# Patient Record
Sex: Female | Born: 1950
Health system: Southern US, Community
[De-identification: ages and names within clinical notes are randomized; demographics above are authoritative.]

## PROBLEM LIST (undated history)

## (undated) DIAGNOSIS — E039 Hypothyroidism, unspecified: Secondary | ICD-10-CM

## (undated) HISTORY — PX: FRACTURE SURGERY: SHX138

## (undated) HISTORY — PX: WRIST SURGERY: SHX841

---

## 2007-05-30 ENCOUNTER — Emergency Department: Payer: Self-pay | Admitting: Emergency Medicine

## 2008-07-11 ENCOUNTER — Inpatient Hospital Stay: Payer: Self-pay | Admitting: General Practice

## 2014-01-16 ENCOUNTER — Ambulatory Visit: Payer: Self-pay | Admitting: Internal Medicine

## 2014-12-08 ENCOUNTER — Encounter
Admission: RE | Disposition: A | Discharge status: Home / Self Care | Payer: Self-pay | Source: Ambulatory Visit | Attending: Unknown Physician Specialty

## 2014-12-08 ENCOUNTER — Ambulatory Visit: Payer: PPO | Admitting: Anesthesiology

## 2014-12-08 ENCOUNTER — Ambulatory Visit
Admission: RE | Admit: 2014-12-08 | Discharge: 2014-12-08 | Disposition: A | Discharge status: Home / Self Care | Payer: PPO | Source: Ambulatory Visit | Attending: Unknown Physician Specialty | Admitting: Unknown Physician Specialty

## 2014-12-08 ENCOUNTER — Encounter: Payer: Self-pay | Admitting: *Deleted

## 2014-12-08 DIAGNOSIS — I10 Essential (primary) hypertension: Secondary | ICD-10-CM | POA: Insufficient documentation

## 2014-12-08 DIAGNOSIS — D122 Benign neoplasm of ascending colon: Secondary | ICD-10-CM | POA: Insufficient documentation

## 2014-12-08 DIAGNOSIS — D125 Benign neoplasm of sigmoid colon: Secondary | ICD-10-CM | POA: Diagnosis not present

## 2014-12-08 DIAGNOSIS — K5730 Diverticulosis of large intestine without perforation or abscess without bleeding: Secondary | ICD-10-CM | POA: Insufficient documentation

## 2014-12-08 DIAGNOSIS — Z79899 Other long term (current) drug therapy: Secondary | ICD-10-CM | POA: Diagnosis not present

## 2014-12-08 DIAGNOSIS — Z86010 Personal history of colonic polyps: Secondary | ICD-10-CM | POA: Insufficient documentation

## 2014-12-08 DIAGNOSIS — K640 First degree hemorrhoids: Secondary | ICD-10-CM | POA: Diagnosis not present

## 2014-12-08 DIAGNOSIS — D123 Benign neoplasm of transverse colon: Secondary | ICD-10-CM | POA: Diagnosis not present

## 2014-12-08 DIAGNOSIS — E039 Hypothyroidism, unspecified: Secondary | ICD-10-CM | POA: Insufficient documentation

## 2014-12-08 DIAGNOSIS — D124 Benign neoplasm of descending colon: Secondary | ICD-10-CM | POA: Diagnosis not present

## 2014-12-08 HISTORY — PX: COLONOSCOPY WITH PROPOFOL: SHX5780

## 2014-12-08 HISTORY — DX: Hypothyroidism, unspecified: E03.9

## 2014-12-08 SURGERY — COLONOSCOPY WITH PROPOFOL
Anesthesia: General

## 2014-12-08 MED ORDER — PROPOFOL INFUSION 10 MG/ML OPTIME
INTRAVENOUS | Status: DC | PRN
  Administered 2014-12-08: 120 ug/kg/min via INTRAVENOUS

## 2014-12-08 MED ORDER — SODIUM CHLORIDE 0.9 % IV SOLN: INTRAVENOUS | Status: DC

## 2014-12-08 MED ORDER — LACTATED RINGERS IV SOLN
INTRAVENOUS | Status: DC
Start: 2014-12-08 — End: 2014-12-08
  Administered 2014-12-08: 09:00:00 via INTRAVENOUS

## 2014-12-08 MED ORDER — FENTANYL CITRATE (PF) 100 MCG/2ML IJ SOLN
INTRAMUSCULAR | Status: DC | PRN
  Administered 2014-12-08: 50 ug via INTRAVENOUS

## 2014-12-08 MED ORDER — EPHEDRINE SULFATE 50 MG/ML IJ SOLN
INTRAMUSCULAR | Status: DC | PRN
  Administered 2014-12-08 (×4): 5 mg via INTRAVENOUS

## 2014-12-08 MED ORDER — MIDAZOLAM HCL 2 MG/2ML IJ SOLN
INTRAMUSCULAR | Status: DC | PRN
  Administered 2014-12-08: 1 mg via INTRAVENOUS

## 2014-12-08 NOTE — H&P (Signed)
Primary Care Physician:  Glendon Axe, MD Primary Gastroenterologist:  Dr. Vira Agar  Pre-Procedure History & Physical: HPI:  Melissa Meadows is a 64 y.o. female is here for an colonoscopy.   Past Medical History  Diagnosis Date  . Hypothyroidism     Past Surgical History  Procedure Laterality Date  . Fracture surgery      LT Knee  . Wrist surgery Left     Prior to Admission medications   Medication Sig Start Date End Date Taking? Authorizing Provider  latanoprost (XALATAN) 0.005 % ophthalmic solution 1 drop at bedtime.   Yes Historical Provider, MD  levothyroxine (SYNTHROID, LEVOTHROID) 75 MCG tablet Take 75 mcg by mouth daily before breakfast.   Yes Historical Provider, MD    Allergies as of 10/07/2014  . (Not on File)    History reviewed. No pertinent family history.  History   Social History  . Marital Status: Widowed    Spouse Name: N/A  . Number of Children: N/A  . Years of Education: N/A   Occupational History  . Not on file.   Social History Main Topics  . Smoking status: Never Smoker   . Smokeless tobacco: Never Used  . Alcohol Use: No  . Drug Use: No  . Sexual Activity: Not on file   Other Topics Concern  . Not on file   Social History Narrative  . No narrative on file    Review of Systems: See HPI, otherwise negative ROS  Physical Exam: BP 112/61 mmHg  Pulse 95  Temp(Src) 98.2 F (36.8 C) (Tympanic)  Resp 16  Ht 5\' 2"  (1.575 m)  Wt 6.35 kg (14 lb)  BMI 2.56 kg/m2  SpO2 97% General:   Alert,  pleasant and cooperative in NAD Head:  Normocephalic and atraumatic. Neck:  Supple; no masses or thyromegaly. Lungs:  Clear throughout to auscultation.    Heart:  Regular rate and rhythm. Abdomen:  Soft, nontender and nondistended. Normal bowel sounds, without guarding, and without rebound.   Neurologic:  Alert and  oriented x4;  grossly normal neurologically.  Impression/Plan: Melissa Meadows is here for an colonoscopy to be performed for  personal history of colon polyps  Risks, benefits, limitations, and alternatives regarding  colonoscopy have been reviewed with the patient.  Questions have been answered.  All parties agreeable.   Gaylyn Cheers, MD  12/08/2014, 8:50 AM   Primary Care Physician:  Glendon Axe, MD Primary Gastroenterologist:  Dr. Vira Agar  Pre-Procedure History & Physical: HPI:  Melissa Meadows is a 64 y.o. female is here for an colonoscopy.   Past Medical History  Diagnosis Date  . Hypothyroidism     Past Surgical History  Procedure Laterality Date  . Fracture surgery      LT Knee  . Wrist surgery Left     Prior to Admission medications   Medication Sig Start Date End Date Taking? Authorizing Provider  latanoprost (XALATAN) 0.005 % ophthalmic solution 1 drop at bedtime.   Yes Historical Provider, MD  levothyroxine (SYNTHROID, LEVOTHROID) 75 MCG tablet Take 75 mcg by mouth daily before breakfast.   Yes Historical Provider, MD    Allergies as of 10/07/2014  . (Not on File)    History reviewed. No pertinent family history.  History   Social History  . Marital Status: Widowed    Spouse Name: N/A  . Number of Children: N/A  . Years of Education: N/A   Occupational History  . Not on file.   Social History  Main Topics  . Smoking status: Never Smoker   . Smokeless tobacco: Never Used  . Alcohol Use: No  . Drug Use: No  . Sexual Activity: Not on file   Other Topics Concern  . Not on file   Social History Narrative  . No narrative on file    Review of Systems: See HPI, otherwise negative ROS  Physical Exam: BP 112/61 mmHg  Pulse 95  Temp(Src) 98.2 F (36.8 C) (Tympanic)  Resp 16  Ht 5\' 2"  (1.575 m)  Wt 6.35 kg (14 lb)  BMI 2.56 kg/m2  SpO2 97% General:   Alert,  pleasant and cooperative in NAD Head:  Normocephalic and atraumatic. Neck:  Supple; no masses or thyromegaly. Lungs:  Clear throughout to auscultation.    Heart:  Regular rate and rhythm. Abdomen:  Soft,  nontender and nondistended. Normal bowel sounds, without guarding, and without rebound.   Neurologic:  Alert and  oriented x4;  grossly normal neurologically.  Impression/Plan: Melissa Meadows is here for an colonoscopy to be performed for personal history of colon polyps  Risks, benefits, limitations, and alternatives regarding  colonoscopy have been reviewed with the patient.  Questions have been answered.  All parties agreeable.   Gaylyn Cheers, MD  12/08/2014, 8:50 AM

## 2014-12-08 NOTE — Transfer of Care (Signed)
Immediate Anesthesia Transfer of Care Note  Patient: Melissa Meadows  Procedure(s) Performed: Procedure(s): COLONOSCOPY WITH PROPOFOL (N/A)  Patient Location: PACU  Anesthesia Type:General  Level of Consciousness: awake and sedated  Airway & Oxygen Therapy: Patient Spontanous Breathing and Patient connected to nasal cannula oxygen  Post-op Assessment: Report given to RN and Post -op Vital signs reviewed and stable  Post vital signs: Reviewed and stable  Last Vitals:  Filed Vitals:   12/08/14 0822  BP: 112/61  Pulse: 95  Temp: 36.8 C  Resp: 16    Complications: No apparent anesthesia complications

## 2014-12-08 NOTE — Anesthesia Procedure Notes (Signed)
Performed by: COOK-MARTIN, Sonna Lipsky Pre-anesthesia Checklist: Patient identified, Emergency Drugs available, Suction available, Patient being monitored and Timeout performed Patient Re-evaluated:Patient Re-evaluated prior to inductionOxygen Delivery Method: Nasal cannula Preoxygenation: Pre-oxygenation with 100% oxygen Intubation Type: IV induction Placement Confirmation: positive ETCO2 and CO2 detector       

## 2014-12-08 NOTE — Anesthesia Postprocedure Evaluation (Signed)
  Anesthesia Post-op Note  Patient: Melissa Meadows  Procedure(s) Performed: Procedure(s): COLONOSCOPY WITH PROPOFOL (N/A)  Anesthesia type:General  Patient location: PACU  Post pain: Pain level controlled  Post assessment: Post-op Vital signs reviewed, Patient's Cardiovascular Status Stable, Respiratory Function Stable, Patent Airway and No signs of Nausea or vomiting  Post vital signs: Reviewed and stable  Last Vitals:  Filed Vitals:   12/08/14 0950  BP: 109/57  Pulse: 74  Temp:   Resp: 15    Level of consciousness: awake, alert  and patient cooperative  Complications: No apparent anesthesia complications

## 2014-12-08 NOTE — Anesthesia Preprocedure Evaluation (Signed)
Anesthesia Evaluation  Patient identified by MRN, date of birth, ID band Patient awake    Reviewed: Allergy & Precautions, NPO status , reviewed documented beta blocker date and time   Airway Mallampati: III       Dental  (+) Upper Dentures, Lower Dentures   Pulmonary neg pulmonary ROS,    Pulmonary exam normal       Cardiovascular hypertension, Normal cardiovascular exam    Neuro/Psych negative neurological ROS  negative psych ROS   GI/Hepatic negative GI ROS, Neg liver ROS,   Endo/Other  Hypothyroidism   Renal/GU      Musculoskeletal negative musculoskeletal ROS (+)   Abdominal Normal abdominal exam  (+)   Peds  Hematology   Anesthesia Other Findings   Reproductive/Obstetrics negative OB ROS                             Anesthesia Physical Anesthesia Plan  ASA: II  Anesthesia Plan: General   Post-op Pain Management:    Induction: Intravenous  Airway Management Planned: Nasal Cannula  Additional Equipment:   Intra-op Plan:   Post-operative Plan:   Informed Consent: I have reviewed the patients History and Physical, chart, labs and discussed the procedure including the risks, benefits and alternatives for the proposed anesthesia with the patient or authorized representative who has indicated his/her understanding and acceptance.     Plan Discussed with: CRNA  Anesthesia Plan Comments:         Anesthesia Quick Evaluation

## 2014-12-08 NOTE — Op Note (Signed)
[facility] Gastroenterology Patient Name: Melissa Meadows Procedure Date: 12/08/2014 8:53 AM MRN: [id number] Account #: [id number] Date of Birth: 01-17-1951 Admit Type: Outpatient Age: 64 Room: [facility] ENDO ROOM 1 Gender: Female Note Status: Finalized Procedure:         Colonoscopy Indications:       Personal history of colonic polyps Providers:         Manya Silvas, MD Referring MD:      Glendon Axe (Referring MD) Medicines:         Propofol per Anesthesia Complications:     No immediate complications. Procedure:         Pre-Anesthesia Assessment:                    - After reviewing the risks and benefits, the patient was                     deemed in satisfactory condition to undergo the procedure.                    After obtaining informed consent, the colonoscope was                     passed under direct vision. Throughout the procedure, the                     patient's blood pressure, pulse, and oxygen saturations                     were monitored continuously. The Colonoscope was                     introduced through the anus and advanced to the the cecum,                     identified by appendiceal orifice and ileocecal valve. The                     colonoscopy was performed without difficulty. The patient                     tolerated the procedure well. The quality of the bowel                     preparation was excellent. Findings:      Many sessile polyps were found in the sigmoid colon, in the descending       colon, in the transverse colon and in the ascending colon. The polyps       were small in size. These polyps were removed with a hot snare. and a       jumbo forceps. Resection and retrieval were complete.      3 were from sigmoid bottle 1      2 from descending bottle 2      43from ascending bottle3 and 4 and 5      2 from transverse bottle 6 and 7      1 from sigmoid bottle 8      Internal hemorrhoids were found during endoscopy.  The hemorrhoids were       small, medium-sized and Grade I (internal hemorrhoids that do not       prolapse).      A single small-mouthed diverticulum was found in the sigmoid colon. Impression:        -  Many small polyps in the sigmoid colon, in the                     descending colon, in the transverse colon and in the                     ascending colon. Resected and retrieved.                    - Internal hemorrhoids.                    - Diverticulosis in the sigmoid colon. Recommendation:    - Await pathology results. Manya Silvas, MD 12/08/2014 9:39:30 AM This report has been signed electronically. Number of Addenda: 0 Note Initiated On: 12/08/2014 8:53 AM Scope Withdrawal Time: 0 hours 14 minutes 23 seconds  Total Procedure Duration: 0 hours 30 minutes 5 seconds       [facility]

## 2014-12-09 ENCOUNTER — Encounter: Payer: Self-pay | Admitting: Unknown Physician Specialty

## 2014-12-10 LAB — SURGICAL PATHOLOGY

## 2015-07-15 DIAGNOSIS — M8580 Other specified disorders of bone density and structure, unspecified site: Secondary | ICD-10-CM | POA: Diagnosis not present

## 2015-07-15 DIAGNOSIS — R740 Nonspecific elevation of levels of transaminase and lactic acid dehydrogenase [LDH]: Secondary | ICD-10-CM | POA: Diagnosis not present

## 2015-09-29 DIAGNOSIS — R740 Nonspecific elevation of levels of transaminase and lactic acid dehydrogenase [LDH]: Secondary | ICD-10-CM | POA: Diagnosis not present

## 2015-10-06 DIAGNOSIS — M8589 Other specified disorders of bone density and structure, multiple sites: Secondary | ICD-10-CM | POA: Diagnosis not present

## 2015-10-06 DIAGNOSIS — J3089 Other allergic rhinitis: Secondary | ICD-10-CM | POA: Diagnosis not present

## 2015-10-06 DIAGNOSIS — R030 Elevated blood-pressure reading, without diagnosis of hypertension: Secondary | ICD-10-CM | POA: Diagnosis not present

## 2015-10-12 DIAGNOSIS — H40053 Ocular hypertension, bilateral: Secondary | ICD-10-CM | POA: Diagnosis not present

## 2016-04-11 DIAGNOSIS — H40053 Ocular hypertension, bilateral: Secondary | ICD-10-CM | POA: Diagnosis not present

## 2016-05-03 DIAGNOSIS — H903 Sensorineural hearing loss, bilateral: Secondary | ICD-10-CM | POA: Diagnosis not present

## 2016-05-06 DIAGNOSIS — E039 Hypothyroidism, unspecified: Secondary | ICD-10-CM | POA: Diagnosis not present

## 2016-05-13 DIAGNOSIS — Q969 Turner's syndrome, unspecified: Secondary | ICD-10-CM | POA: Diagnosis not present

## 2016-05-13 DIAGNOSIS — E039 Hypothyroidism, unspecified: Secondary | ICD-10-CM | POA: Diagnosis not present

## 2016-05-13 DIAGNOSIS — E063 Autoimmune thyroiditis: Secondary | ICD-10-CM | POA: Diagnosis not present

## 2016-10-05 DIAGNOSIS — Z131 Encounter for screening for diabetes mellitus: Secondary | ICD-10-CM | POA: Diagnosis not present

## 2016-10-05 DIAGNOSIS — Z23 Encounter for immunization: Secondary | ICD-10-CM | POA: Diagnosis not present

## 2016-10-05 DIAGNOSIS — Z1231 Encounter for screening mammogram for malignant neoplasm of breast: Secondary | ICD-10-CM | POA: Diagnosis not present

## 2016-10-05 DIAGNOSIS — Z0000 Encounter for general adult medical examination without abnormal findings: Secondary | ICD-10-CM | POA: Diagnosis not present

## 2016-10-05 DIAGNOSIS — M8589 Other specified disorders of bone density and structure, multiple sites: Secondary | ICD-10-CM | POA: Diagnosis not present

## 2016-10-05 DIAGNOSIS — R030 Elevated blood-pressure reading, without diagnosis of hypertension: Secondary | ICD-10-CM | POA: Diagnosis not present

## 2016-10-10 DIAGNOSIS — H40053 Ocular hypertension, bilateral: Secondary | ICD-10-CM | POA: Diagnosis not present

## 2016-10-12 DIAGNOSIS — R319 Hematuria, unspecified: Secondary | ICD-10-CM | POA: Diagnosis not present

## 2016-10-12 DIAGNOSIS — M8589 Other specified disorders of bone density and structure, multiple sites: Secondary | ICD-10-CM | POA: Diagnosis not present

## 2016-10-12 DIAGNOSIS — M8588 Other specified disorders of bone density and structure, other site: Secondary | ICD-10-CM | POA: Diagnosis not present

## 2016-10-12 DIAGNOSIS — R8290 Unspecified abnormal findings in urine: Secondary | ICD-10-CM | POA: Diagnosis not present

## 2016-12-30 DIAGNOSIS — M47812 Spondylosis without myelopathy or radiculopathy, cervical region: Secondary | ICD-10-CM | POA: Diagnosis not present

## 2016-12-30 DIAGNOSIS — M542 Cervicalgia: Secondary | ICD-10-CM | POA: Diagnosis not present

## 2017-04-10 DIAGNOSIS — H40053 Ocular hypertension, bilateral: Secondary | ICD-10-CM | POA: Diagnosis not present

## 2017-04-24 DIAGNOSIS — Q969 Turner's syndrome, unspecified: Secondary | ICD-10-CM | POA: Diagnosis not present

## 2017-05-05 DIAGNOSIS — E039 Hypothyroidism, unspecified: Secondary | ICD-10-CM | POA: Diagnosis not present

## 2017-05-15 DIAGNOSIS — E038 Other specified hypothyroidism: Secondary | ICD-10-CM | POA: Diagnosis not present

## 2017-05-15 DIAGNOSIS — E063 Autoimmune thyroiditis: Secondary | ICD-10-CM | POA: Diagnosis not present

## 2017-05-15 DIAGNOSIS — Q969 Turner's syndrome, unspecified: Secondary | ICD-10-CM | POA: Diagnosis not present

## 2017-07-10 DIAGNOSIS — D485 Neoplasm of uncertain behavior of skin: Secondary | ICD-10-CM | POA: Diagnosis not present

## 2017-07-10 DIAGNOSIS — M6740 Ganglion, unspecified site: Secondary | ICD-10-CM | POA: Diagnosis not present

## 2017-07-10 DIAGNOSIS — L918 Other hypertrophic disorders of the skin: Secondary | ICD-10-CM | POA: Diagnosis not present

## 2017-07-10 DIAGNOSIS — L821 Other seborrheic keratosis: Secondary | ICD-10-CM | POA: Diagnosis not present

## 2017-07-24 DIAGNOSIS — C44319 Basal cell carcinoma of skin of other parts of face: Secondary | ICD-10-CM | POA: Diagnosis not present

## 2017-07-24 DIAGNOSIS — D485 Neoplasm of uncertain behavior of skin: Secondary | ICD-10-CM | POA: Diagnosis not present

## 2017-08-02 DIAGNOSIS — C44319 Basal cell carcinoma of skin of other parts of face: Secondary | ICD-10-CM | POA: Diagnosis not present

## 2017-08-16 DIAGNOSIS — C44319 Basal cell carcinoma of skin of other parts of face: Secondary | ICD-10-CM | POA: Diagnosis not present

## 2017-09-06 DIAGNOSIS — C44319 Basal cell carcinoma of skin of other parts of face: Secondary | ICD-10-CM | POA: Diagnosis not present

## 2017-09-28 DIAGNOSIS — H40053 Ocular hypertension, bilateral: Secondary | ICD-10-CM | POA: Diagnosis not present

## 2018-01-01 DIAGNOSIS — C44319 Basal cell carcinoma of skin of other parts of face: Secondary | ICD-10-CM | POA: Diagnosis not present

## 2018-01-01 DIAGNOSIS — L821 Other seborrheic keratosis: Secondary | ICD-10-CM | POA: Diagnosis not present

## 2018-01-01 DIAGNOSIS — L720 Epidermal cyst: Secondary | ICD-10-CM | POA: Diagnosis not present

## 2018-01-09 DIAGNOSIS — E038 Other specified hypothyroidism: Secondary | ICD-10-CM | POA: Diagnosis not present

## 2018-01-09 DIAGNOSIS — E063 Autoimmune thyroiditis: Secondary | ICD-10-CM | POA: Diagnosis not present

## 2018-01-09 DIAGNOSIS — E538 Deficiency of other specified B group vitamins: Secondary | ICD-10-CM | POA: Diagnosis not present

## 2018-01-09 DIAGNOSIS — E519 Thiamine deficiency, unspecified: Secondary | ICD-10-CM | POA: Diagnosis not present

## 2018-01-09 DIAGNOSIS — R413 Other amnesia: Secondary | ICD-10-CM | POA: Diagnosis not present

## 2018-01-10 ENCOUNTER — Other Ambulatory Visit (HOSPITAL_COMMUNITY): Payer: Self-pay | Admitting: Neurology

## 2018-01-10 DIAGNOSIS — E519 Thiamine deficiency, unspecified: Secondary | ICD-10-CM | POA: Diagnosis not present

## 2018-01-10 DIAGNOSIS — E063 Autoimmune thyroiditis: Secondary | ICD-10-CM | POA: Diagnosis not present

## 2018-01-10 DIAGNOSIS — E538 Deficiency of other specified B group vitamins: Secondary | ICD-10-CM | POA: Diagnosis not present

## 2018-01-10 DIAGNOSIS — R413 Other amnesia: Secondary | ICD-10-CM | POA: Diagnosis not present

## 2018-01-10 DIAGNOSIS — E038 Other specified hypothyroidism: Secondary | ICD-10-CM | POA: Diagnosis not present

## 2018-01-16 ENCOUNTER — Ambulatory Visit (HOSPITAL_COMMUNITY)
Admission: RE | Admit: 2018-01-16 | Discharge: 2018-01-16 | Disposition: A | Discharge status: Home / Self Care | Payer: PPO | Source: Ambulatory Visit | Attending: Neurology | Admitting: Neurology

## 2018-01-16 DIAGNOSIS — R930 Abnormal findings on diagnostic imaging of skull and head, not elsewhere classified: Secondary | ICD-10-CM | POA: Diagnosis not present

## 2018-01-16 DIAGNOSIS — R413 Other amnesia: Secondary | ICD-10-CM | POA: Diagnosis not present

## 2018-01-17 DIAGNOSIS — E538 Deficiency of other specified B group vitamins: Secondary | ICD-10-CM | POA: Diagnosis not present

## 2018-01-17 DIAGNOSIS — D510 Vitamin B12 deficiency anemia due to intrinsic factor deficiency: Secondary | ICD-10-CM | POA: Diagnosis not present

## 2018-01-24 DIAGNOSIS — E538 Deficiency of other specified B group vitamins: Secondary | ICD-10-CM | POA: Diagnosis not present

## 2018-02-01 DIAGNOSIS — E538 Deficiency of other specified B group vitamins: Secondary | ICD-10-CM | POA: Diagnosis not present

## 2018-02-08 DIAGNOSIS — E538 Deficiency of other specified B group vitamins: Secondary | ICD-10-CM | POA: Diagnosis not present

## 2018-03-12 DIAGNOSIS — E538 Deficiency of other specified B group vitamins: Secondary | ICD-10-CM | POA: Diagnosis not present

## 2018-03-28 DIAGNOSIS — H2513 Age-related nuclear cataract, bilateral: Secondary | ICD-10-CM | POA: Diagnosis not present

## 2018-04-11 DIAGNOSIS — Q969 Turner's syndrome, unspecified: Secondary | ICD-10-CM | POA: Diagnosis not present

## 2018-04-11 DIAGNOSIS — E538 Deficiency of other specified B group vitamins: Secondary | ICD-10-CM | POA: Diagnosis not present

## 2018-04-11 DIAGNOSIS — R413 Other amnesia: Secondary | ICD-10-CM | POA: Diagnosis not present

## 2018-04-12 DIAGNOSIS — E538 Deficiency of other specified B group vitamins: Secondary | ICD-10-CM | POA: Diagnosis not present

## 2018-05-10 DIAGNOSIS — E038 Other specified hypothyroidism: Secondary | ICD-10-CM | POA: Diagnosis not present

## 2018-05-10 DIAGNOSIS — E063 Autoimmune thyroiditis: Secondary | ICD-10-CM | POA: Diagnosis not present

## 2018-05-18 DIAGNOSIS — E063 Autoimmune thyroiditis: Secondary | ICD-10-CM | POA: Diagnosis not present

## 2018-05-18 DIAGNOSIS — Q969 Turner's syndrome, unspecified: Secondary | ICD-10-CM | POA: Diagnosis not present

## 2018-05-18 DIAGNOSIS — E038 Other specified hypothyroidism: Secondary | ICD-10-CM | POA: Diagnosis not present

## 2018-05-18 DIAGNOSIS — E041 Nontoxic single thyroid nodule: Secondary | ICD-10-CM | POA: Diagnosis not present

## 2018-07-18 DIAGNOSIS — E038 Other specified hypothyroidism: Secondary | ICD-10-CM | POA: Diagnosis not present

## 2018-07-18 DIAGNOSIS — E063 Autoimmune thyroiditis: Secondary | ICD-10-CM | POA: Diagnosis not present

## 2018-11-26 DIAGNOSIS — L821 Other seborrheic keratosis: Secondary | ICD-10-CM | POA: Diagnosis not present

## 2018-11-26 DIAGNOSIS — R030 Elevated blood-pressure reading, without diagnosis of hypertension: Secondary | ICD-10-CM | POA: Diagnosis not present

## 2018-11-26 DIAGNOSIS — E063 Autoimmune thyroiditis: Secondary | ICD-10-CM | POA: Diagnosis not present

## 2018-11-26 DIAGNOSIS — C4441 Basal cell carcinoma of skin of scalp and neck: Secondary | ICD-10-CM | POA: Diagnosis not present

## 2018-11-26 DIAGNOSIS — C44519 Basal cell carcinoma of skin of other part of trunk: Secondary | ICD-10-CM | POA: Diagnosis not present

## 2018-11-26 DIAGNOSIS — E538 Deficiency of other specified B group vitamins: Secondary | ICD-10-CM | POA: Diagnosis not present

## 2018-11-26 DIAGNOSIS — M8589 Other specified disorders of bone density and structure, multiple sites: Secondary | ICD-10-CM | POA: Diagnosis not present

## 2018-11-26 DIAGNOSIS — D1801 Hemangioma of skin and subcutaneous tissue: Secondary | ICD-10-CM | POA: Diagnosis not present

## 2018-11-26 DIAGNOSIS — D485 Neoplasm of uncertain behavior of skin: Secondary | ICD-10-CM | POA: Diagnosis not present

## 2018-11-26 DIAGNOSIS — L570 Actinic keratosis: Secondary | ICD-10-CM | POA: Diagnosis not present

## 2018-12-10 DIAGNOSIS — H40053 Ocular hypertension, bilateral: Secondary | ICD-10-CM | POA: Diagnosis not present

## 2018-12-11 DIAGNOSIS — C44519 Basal cell carcinoma of skin of other part of trunk: Secondary | ICD-10-CM | POA: Diagnosis not present

## 2019-01-02 DIAGNOSIS — C44519 Basal cell carcinoma of skin of other part of trunk: Secondary | ICD-10-CM | POA: Diagnosis not present

## 2019-01-02 DIAGNOSIS — C4441 Basal cell carcinoma of skin of scalp and neck: Secondary | ICD-10-CM | POA: Diagnosis not present

## 2019-01-14 DIAGNOSIS — E038 Other specified hypothyroidism: Secondary | ICD-10-CM | POA: Diagnosis not present

## 2019-01-14 DIAGNOSIS — E063 Autoimmune thyroiditis: Secondary | ICD-10-CM | POA: Diagnosis not present

## 2019-01-21 DIAGNOSIS — E063 Autoimmune thyroiditis: Secondary | ICD-10-CM | POA: Diagnosis not present

## 2019-01-21 DIAGNOSIS — E038 Other specified hypothyroidism: Secondary | ICD-10-CM | POA: Diagnosis not present

## 2019-01-21 DIAGNOSIS — Q969 Turner's syndrome, unspecified: Secondary | ICD-10-CM | POA: Diagnosis not present

## 2019-01-23 DIAGNOSIS — C4441 Basal cell carcinoma of skin of scalp and neck: Secondary | ICD-10-CM | POA: Diagnosis not present

## 2019-01-23 DIAGNOSIS — C44519 Basal cell carcinoma of skin of other part of trunk: Secondary | ICD-10-CM | POA: Diagnosis not present

## 2019-03-01 DIAGNOSIS — Z23 Encounter for immunization: Secondary | ICD-10-CM | POA: Diagnosis not present

## 2019-04-29 DIAGNOSIS — L578 Other skin changes due to chronic exposure to nonionizing radiation: Secondary | ICD-10-CM | POA: Diagnosis not present

## 2019-04-29 DIAGNOSIS — C4441 Basal cell carcinoma of skin of scalp and neck: Secondary | ICD-10-CM | POA: Diagnosis not present

## 2019-04-29 DIAGNOSIS — C44519 Basal cell carcinoma of skin of other part of trunk: Secondary | ICD-10-CM | POA: Diagnosis not present

## 2019-06-03 DIAGNOSIS — E041 Nontoxic single thyroid nodule: Secondary | ICD-10-CM | POA: Diagnosis not present

## 2019-06-03 DIAGNOSIS — E038 Other specified hypothyroidism: Secondary | ICD-10-CM | POA: Diagnosis not present

## 2019-06-03 DIAGNOSIS — E063 Autoimmune thyroiditis: Secondary | ICD-10-CM | POA: Diagnosis not present

## 2019-06-03 DIAGNOSIS — Q969 Turner's syndrome, unspecified: Secondary | ICD-10-CM | POA: Diagnosis not present

## 2019-06-10 DIAGNOSIS — E063 Autoimmune thyroiditis: Secondary | ICD-10-CM | POA: Diagnosis not present

## 2019-06-10 DIAGNOSIS — E038 Other specified hypothyroidism: Secondary | ICD-10-CM | POA: Diagnosis not present

## 2019-06-10 DIAGNOSIS — E041 Nontoxic single thyroid nodule: Secondary | ICD-10-CM | POA: Diagnosis not present

## 2019-06-10 DIAGNOSIS — Q969 Turner's syndrome, unspecified: Secondary | ICD-10-CM | POA: Diagnosis not present

## 2019-06-17 DIAGNOSIS — H40053 Ocular hypertension, bilateral: Secondary | ICD-10-CM | POA: Diagnosis not present

## 2019-09-20 DIAGNOSIS — Z01812 Encounter for preprocedural laboratory examination: Secondary | ICD-10-CM | POA: Diagnosis not present

## 2019-09-20 DIAGNOSIS — R030 Elevated blood-pressure reading, without diagnosis of hypertension: Secondary | ICD-10-CM | POA: Diagnosis not present

## 2019-09-20 DIAGNOSIS — Z86010 Personal history of colonic polyps: Secondary | ICD-10-CM | POA: Diagnosis not present

## 2019-10-31 DIAGNOSIS — Z01812 Encounter for preprocedural laboratory examination: Secondary | ICD-10-CM | POA: Diagnosis not present

## 2019-11-05 DIAGNOSIS — Z86010 Personal history of colonic polyps: Secondary | ICD-10-CM | POA: Diagnosis not present

## 2019-11-05 DIAGNOSIS — K5730 Diverticulosis of large intestine without perforation or abscess without bleeding: Secondary | ICD-10-CM | POA: Diagnosis not present

## 2019-11-05 DIAGNOSIS — D175 Benign lipomatous neoplasm of intra-abdominal organs: Secondary | ICD-10-CM | POA: Diagnosis not present

## 2019-11-05 DIAGNOSIS — K640 First degree hemorrhoids: Secondary | ICD-10-CM | POA: Diagnosis not present

## 2019-11-05 DIAGNOSIS — Z1211 Encounter for screening for malignant neoplasm of colon: Secondary | ICD-10-CM | POA: Diagnosis not present

## 2019-12-26 DIAGNOSIS — H40053 Ocular hypertension, bilateral: Secondary | ICD-10-CM | POA: Diagnosis not present

## 2020-01-29 DIAGNOSIS — K5790 Diverticulosis of intestine, part unspecified, without perforation or abscess without bleeding: Secondary | ICD-10-CM | POA: Diagnosis not present

## 2020-01-29 DIAGNOSIS — D175 Benign lipomatous neoplasm of intra-abdominal organs: Secondary | ICD-10-CM | POA: Diagnosis not present

## 2020-01-29 DIAGNOSIS — Z86010 Personal history of colonic polyps: Secondary | ICD-10-CM | POA: Diagnosis not present

## 2020-06-03 DIAGNOSIS — E038 Other specified hypothyroidism: Secondary | ICD-10-CM | POA: Diagnosis not present

## 2020-06-03 DIAGNOSIS — E063 Autoimmune thyroiditis: Secondary | ICD-10-CM | POA: Diagnosis not present

## 2020-06-10 DIAGNOSIS — Q969 Turner's syndrome, unspecified: Secondary | ICD-10-CM | POA: Diagnosis not present

## 2020-06-10 DIAGNOSIS — E063 Autoimmune thyroiditis: Secondary | ICD-10-CM | POA: Diagnosis not present

## 2020-06-10 DIAGNOSIS — E038 Other specified hypothyroidism: Secondary | ICD-10-CM | POA: Diagnosis not present

## 2020-06-25 DIAGNOSIS — H40053 Ocular hypertension, bilateral: Secondary | ICD-10-CM | POA: Diagnosis not present

## 2020-08-20 DIAGNOSIS — Z136 Encounter for screening for cardiovascular disorders: Secondary | ICD-10-CM | POA: Diagnosis not present

## 2020-08-20 DIAGNOSIS — Z13220 Encounter for screening for lipoid disorders: Secondary | ICD-10-CM | POA: Diagnosis not present

## 2020-08-27 DIAGNOSIS — E538 Deficiency of other specified B group vitamins: Secondary | ICD-10-CM | POA: Diagnosis not present

## 2020-08-27 DIAGNOSIS — Z0000 Encounter for general adult medical examination without abnormal findings: Secondary | ICD-10-CM | POA: Diagnosis not present

## 2020-08-27 DIAGNOSIS — E063 Autoimmune thyroiditis: Secondary | ICD-10-CM | POA: Diagnosis not present

## 2020-09-28 DIAGNOSIS — E538 Deficiency of other specified B group vitamins: Secondary | ICD-10-CM | POA: Diagnosis not present

## 2020-10-02 DIAGNOSIS — E063 Autoimmune thyroiditis: Secondary | ICD-10-CM | POA: Diagnosis not present

## 2020-10-02 DIAGNOSIS — R030 Elevated blood-pressure reading, without diagnosis of hypertension: Secondary | ICD-10-CM | POA: Diagnosis not present

## 2020-10-02 DIAGNOSIS — E038 Other specified hypothyroidism: Secondary | ICD-10-CM | POA: Diagnosis not present

## 2020-10-02 DIAGNOSIS — E538 Deficiency of other specified B group vitamins: Secondary | ICD-10-CM | POA: Diagnosis not present

## 2020-12-23 DIAGNOSIS — H40053 Ocular hypertension, bilateral: Secondary | ICD-10-CM | POA: Diagnosis not present

## 2020-12-24 DIAGNOSIS — S62661A Nondisplaced fracture of distal phalanx of left index finger, initial encounter for closed fracture: Secondary | ICD-10-CM | POA: Diagnosis not present

## 2020-12-24 DIAGNOSIS — M13842 Other specified arthritis, left hand: Secondary | ICD-10-CM | POA: Diagnosis not present

## 2020-12-24 DIAGNOSIS — S62651A Nondisplaced fracture of medial phalanx of left index finger, initial encounter for closed fracture: Secondary | ICD-10-CM | POA: Diagnosis not present

## 2021-01-07 DIAGNOSIS — M79645 Pain in left finger(s): Secondary | ICD-10-CM | POA: Diagnosis not present

## 2021-05-12 DIAGNOSIS — I10 Essential (primary) hypertension: Secondary | ICD-10-CM | POA: Diagnosis not present

## 2021-05-12 DIAGNOSIS — Q969 Turner's syndrome, unspecified: Secondary | ICD-10-CM | POA: Diagnosis not present

## 2021-06-09 DIAGNOSIS — E063 Autoimmune thyroiditis: Secondary | ICD-10-CM | POA: Diagnosis not present

## 2021-06-09 DIAGNOSIS — E038 Other specified hypothyroidism: Secondary | ICD-10-CM | POA: Diagnosis not present

## 2021-06-16 DIAGNOSIS — E063 Autoimmune thyroiditis: Secondary | ICD-10-CM | POA: Diagnosis not present

## 2021-06-16 DIAGNOSIS — Q969 Turner's syndrome, unspecified: Secondary | ICD-10-CM | POA: Diagnosis not present

## 2021-06-16 DIAGNOSIS — E038 Other specified hypothyroidism: Secondary | ICD-10-CM | POA: Diagnosis not present

## 2021-06-21 DIAGNOSIS — H40053 Ocular hypertension, bilateral: Secondary | ICD-10-CM | POA: Diagnosis not present

## 2021-08-23 DIAGNOSIS — E038 Other specified hypothyroidism: Secondary | ICD-10-CM | POA: Diagnosis not present

## 2021-08-23 DIAGNOSIS — R030 Elevated blood-pressure reading, without diagnosis of hypertension: Secondary | ICD-10-CM | POA: Diagnosis not present

## 2021-08-23 DIAGNOSIS — E538 Deficiency of other specified B group vitamins: Secondary | ICD-10-CM | POA: Diagnosis not present

## 2021-08-30 DIAGNOSIS — Z1389 Encounter for screening for other disorder: Secondary | ICD-10-CM | POA: Diagnosis not present

## 2021-08-30 DIAGNOSIS — E538 Deficiency of other specified B group vitamins: Secondary | ICD-10-CM | POA: Diagnosis not present

## 2021-08-30 DIAGNOSIS — E063 Autoimmune thyroiditis: Secondary | ICD-10-CM | POA: Diagnosis not present

## 2021-08-30 DIAGNOSIS — I517 Cardiomegaly: Secondary | ICD-10-CM | POA: Diagnosis not present

## 2021-08-30 DIAGNOSIS — E038 Other specified hypothyroidism: Secondary | ICD-10-CM | POA: Diagnosis not present

## 2021-08-30 DIAGNOSIS — Z0000 Encounter for general adult medical examination without abnormal findings: Secondary | ICD-10-CM | POA: Diagnosis not present

## 2021-09-08 DIAGNOSIS — I10 Essential (primary) hypertension: Secondary | ICD-10-CM | POA: Diagnosis not present

## 2021-09-08 DIAGNOSIS — I517 Cardiomegaly: Secondary | ICD-10-CM | POA: Diagnosis not present

## 2021-09-08 DIAGNOSIS — R0602 Shortness of breath: Secondary | ICD-10-CM | POA: Diagnosis not present

## 2021-09-08 DIAGNOSIS — I447 Left bundle-branch block, unspecified: Secondary | ICD-10-CM | POA: Diagnosis not present

## 2021-09-22 DIAGNOSIS — I447 Left bundle-branch block, unspecified: Secondary | ICD-10-CM | POA: Diagnosis not present

## 2021-09-22 DIAGNOSIS — R0602 Shortness of breath: Secondary | ICD-10-CM | POA: Diagnosis not present

## 2021-09-30 DIAGNOSIS — I517 Cardiomegaly: Secondary | ICD-10-CM | POA: Diagnosis not present

## 2021-09-30 DIAGNOSIS — I10 Essential (primary) hypertension: Secondary | ICD-10-CM | POA: Diagnosis not present

## 2021-09-30 DIAGNOSIS — I447 Left bundle-branch block, unspecified: Secondary | ICD-10-CM | POA: Diagnosis not present

## 2021-12-02 ENCOUNTER — Ambulatory Visit (INDEPENDENT_AMBULATORY_CARE_PROVIDER_SITE_OTHER): Payer: Medicare HMO | Admitting: Dermatology

## 2021-12-02 DIAGNOSIS — Z85828 Personal history of other malignant neoplasm of skin: Secondary | ICD-10-CM | POA: Diagnosis not present

## 2021-12-02 DIAGNOSIS — L821 Other seborrheic keratosis: Secondary | ICD-10-CM

## 2021-12-02 DIAGNOSIS — L570 Actinic keratosis: Secondary | ICD-10-CM

## 2021-12-02 NOTE — Progress Notes (Signed)
   New Patient Visit  Subjective  Melissa Meadows is a 71 y.o. female who presents for the following: Skin Problem (New patient here today for an itchy spot at the left neck. Patient advises she has a hx of BCC. ).  The following portions of the chart were reviewed this encounter and updated as appropriate:   Tobacco  Allergies  Meds  Problems  Med Hx  Surg Hx  Fam Hx      Review of Systems:  No other skin or systemic complaints except as noted in HPI or Assessment and Plan.  Objective  Well appearing patient in no apparent distress; mood and affect are within normal limits.  A focused examination was performed including neck, arms, legs. Relevant physical exam findings are noted in the Assessment and Plan.  Left Anterior Neck Erythematous thin papules/macules with gritty scale.     Assessment & Plan  AK (actinic keratosis) Left Anterior Neck  Actinic keratoses are precancerous spots that appear secondary to cumulative UV radiation exposure/sun exposure over time. They are chronic with expected duration over 1 year. A portion of actinic keratoses will progress to squamous cell carcinoma of the skin. It is not possible to reliably predict which spots will progress to skin cancer and so treatment is recommended to prevent development of skin cancer.  Recommend daily broad spectrum sunscreen SPF 30+ to sun-exposed areas, reapply every 2 hours as needed.  Recommend staying in the shade or wearing long sleeves, sun glasses (UVA+UVB protection) and wide brim hats (4-inch brim around the entire circumference of the hat). Call for new or changing lesions.  Prior to procedure, discussed risks of blister formation, small wound, skin dyspigmentation, or rare scar following cryotherapy. Recommend Vaseline ointment to treated areas while healing.  Recheck on follow up, bx if indicated.    Destruction of lesion - Left Anterior Neck  Destruction method: cryotherapy   Informed consent:  discussed and consent obtained   Lesion destroyed using liquid nitrogen: Yes   Cryotherapy cycles:  2 Outcome: patient tolerated procedure well with no complications   Post-procedure details: wound care instructions given    Seborrheic Keratoses - Stuck-on, waxy, tan-brown papules and/or plaques  - Benign-appearing - Discussed benign etiology and prognosis. - Observe - Call for any changes  Return in about 3 months (around 03/04/2022) for AK follow up.  Graciella Belton, RMA, am acting as scribe for Forest Gleason, MD .  Documentation: I have reviewed the above documentation for accuracy and completeness, and I agree with the above.  Forest Gleason, MD

## 2021-12-02 NOTE — Patient Instructions (Addendum)
Cryotherapy Aftercare  Wash gently with soap and water everyday.   Apply Vaseline and Band-Aid daily until healed.    Gentle Skin Care Guide  1. Bathe no more than once a day.  2. Avoid bathing in hot water  3. Use a mild soap like Dove, Vanicream, Cetaphil, CeraVe. Can use Lever 2000 or Cetaphil antibacterial soap  4. Use soap only where you need it. On most days, use it under your arms, between your legs, and on your feet. Let the water rinse other areas unless visibly dirty.  5. When you get out of the bath/shower, use a towel to gently blot your skin dry, don't rub it.  6. While your skin is still a little damp, apply a moisturizing cream such as Vanicream, CeraVe, Cetaphil, Eucerin, Sarna lotion or plain Vaseline Jelly. For hands apply Neutrogena [place] Hand Cream or Excipial Hand Cream.  7. Reapply moisturizer any time you start to itch or feel dry.  8. Sometimes using free and clear laundry detergents can be helpful. Fabric softener sheets should be avoided. Downy Free & Gentle liquid, or any liquid fabric softener that is free of dyes and perfumes, it acceptable to use  9. If your doctor has given you prescription creams you may apply moisturizers over them   Recommend taking Heliocare sun protection supplement daily in sunny weather for additional sun protection. For maximum protection on the sunniest days, you can take up to 2 capsules of regular Heliocare OR take 1 capsule of Heliocare Ultra. For prolonged exposure (such as a full day in the sun), you can repeat your dose of the supplement 4 hours after your first dose. Heliocare can be purchased at [employer], at some [facility] or at [email or web address].    Recommend daily broad spectrum sunscreen SPF 30+ to sun-exposed areas, reapply every 2 hours as needed. Call for new or changing lesions.  Staying in the shade or wearing long sleeves, sun glasses (UVA+UVB protection) and wide brim hats (4-inch brim around the  entire circumference of the hat) are also recommended for sun protection.   Due to recent changes in healthcare laws, you may see results of your pathology and/or laboratory studies on MyChart before the doctors have had a chance to review them. We understand that in some cases there may be results that are confusing or concerning to you. Please understand that not all results are received at the same time and often the doctors may need to interpret multiple results in order to provide you with the best plan of care or course of treatment. Therefore, we ask that you please give [place] 2 business days to thoroughly review all your results before contacting the office for clarification. Should we see a critical lab result, you will be contacted sooner.   If You Need Anything After Your Visit  If you have any questions or concerns for your doctor, please call our main line at [phone] and press option 4 to reach your doctor's medical assistant. If no one answers, please leave a voicemail as directed and we will return your call as soon as possible. Messages left after 4 pm will be answered the following business day.   You may also send [place] a message via [place]. We typically respond to MyChart messages within 1-2 business days.  For prescription refills, please ask your pharmacy to contact our office. Our fax number is [phone].  If you have an urgent issue when the clinic is closed that cannot wait until  the next business day, you can page your doctor at the number below.    Please note that while we do our best to be available for urgent issues outside of office hours, we are not available 24/7.   If you have an urgent issue and are unable to reach [place], you may choose to seek medical care at your doctor's office, retail clinic, urgent care center, or emergency room.  If you have a medical emergency, please immediately call 911 or go to the emergency department.  Pager Numbers  - Dr.  Nehemiah Massed: [phone]  - Dr. Laurence Ferrari: [phone]  - Dr. Nicole Kindred: [phone]  In the event of inclement weather, please call our main line at [phone] for an update on the status of any delays or closures.  Dermatology Medication Tips: Please keep the boxes that topical medications come in in order to help keep track of the instructions about where and how to use these. Pharmacies typically print the medication instructions only on the boxes and not directly on the medication tubes.   If your medication is too expensive, please contact our office at [phone] option 4 or send [place] a message through [place].   We are unable to tell what your co-pay for medications will be in advance as this is different depending on your insurance coverage. However, we may be able to find a substitute medication at lower cost or fill out paperwork to get insurance to cover a needed medication.   If a prior authorization is required to get your medication covered by your insurance company, please allow [place] 1-2 business days to complete this process.  Drug prices often vary depending on where the prescription is filled and some pharmacies may offer cheaper prices.  The website www.goodrx.com contains coupons for medications through different pharmacies. The prices here do not account for what the cost may be with help from insurance (it may be cheaper with your insurance), but the website can give you the price if you did not use any insurance.  - You can print the associated coupon and take it with your prescription to the pharmacy.  - You may also stop by our office during regular business hours and pick up a GoodRx coupon card.  - If you need your prescription sent electronically to a different pharmacy, notify our office through [facility] or by phone at [phone] option 4.     Si Usted Necesita Algo Despus de Su Visita  Tambin puede enviarnos un mensaje a travs de Pharmacist, community. Por lo  general respondemos a los mensajes de MyChart en el transcurso de 1 a 2 das hbiles.  Para renovar recetas, por favor pida a su farmacia que se ponga en contacto con nuestra oficina. Harland Dingwall de fax es [place] [phone].  Si tiene un asunto urgente cuando la clnica est cerrada y que no puede esperar hasta el siguiente da hbil, puede llamar/localizar a su doctor(a) al nmero que aparece a continuacin.   Por favor, tenga en cuenta que aunque hacemos todo lo posible para estar disponibles para asuntos urgentes fuera del horario de [place], no estamos disponibles las 24 horas del da, los 7 das de la [place].   Si tiene un problema urgente y no puede comunicarse con nosotros, puede optar por buscar atencin mdica  en el consultorio de su doctor(a), en una clnica privada, en un centro de atencin urgente o en una sala de emergencias.  Si tiene [employer], por favor llame inmediatamente al  [place] a la sala de emergencias.  Nmeros de bper  - Dr. Nehemiah Massed: [phone]  - Dra. Moye: [phone]  - Dra. Nicole Kindred: [phone]  En caso de inclemencias del [place], por favor llame a Johnsie Kindred principal al [phone] para una actualizacin sobre el [place] de cualquier retraso o cierre.  Consejos para la medicacin en dermatologa: Por favor, guarde las cajas en las que vienen los medicamentos de uso tpico para ayudarle a seguir las instrucciones sobre dnde y cmo usarlos. Las farmacias generalmente imprimen las instrucciones del medicamento slo en las cajas y no directamente en los tubos del [place].   Si su medicamento es muy caro, por favor, pngase en contacto con Zigmund Daniel llamando al [phone] y presione la opcin 4 o envenos un mensaje a travs de Pharmacist, community.   No podemos decirle cul ser su copago por los medicamentos por adelantado ya que esto es diferente dependiendo de la cobertura de su seguro. Sin embargo, es posible que podamos encontrar un  medicamento sustituto a Electrical engineer un formulario para que el seguro cubra el medicamento que se considera necesario.   Si se requiere una autorizacin previa para que su compaa de seguros [place] su medicamento, por favor permtanos de 1 a 2 das hbiles para completar este proceso.  Los precios de los medicamentos varan con frecuencia dependiendo del Environmental consultant de dnde se surte la receta y alguna farmacias pueden ofrecer precios ms baratos.  El sitio web www.goodrx.com tiene cupones para medicamentos de Airline pilot. Los precios aqu no tienen en cuenta lo que podra costar con la ayuda del seguro (puede ser ms barato con su seguro), pero el sitio web puede darle el precio si no utiliz Research scientist (physical sciences).  - Puede imprimir el cupn correspondiente y llevarlo con su receta a la farmacia.  - Tambin puede pasar por nuestra oficina durante el horario de atencin regular y Charity fundraiser una tarjeta de cupones de GoodRx.  - Si necesita que su receta se enve electrnicamente a una farmacia diferente, informe a nuestra oficina a travs de MyChart de [place] o por telfono llamando al [phone] y presione la opcin 4.

## 2021-12-13 ENCOUNTER — Encounter: Payer: Self-pay | Admitting: Dermatology

## 2021-12-20 DIAGNOSIS — H40053 Ocular hypertension, bilateral: Secondary | ICD-10-CM | POA: Diagnosis not present

## 2021-12-20 DIAGNOSIS — H2513 Age-related nuclear cataract, bilateral: Secondary | ICD-10-CM | POA: Diagnosis not present

## 2022-03-10 ENCOUNTER — Ambulatory Visit (INDEPENDENT_AMBULATORY_CARE_PROVIDER_SITE_OTHER): Payer: Medicare HMO | Admitting: Dermatology

## 2022-03-10 DIAGNOSIS — L659 Nonscarring hair loss, unspecified: Secondary | ICD-10-CM | POA: Diagnosis not present

## 2022-03-10 DIAGNOSIS — E569 Vitamin deficiency, unspecified: Secondary | ICD-10-CM | POA: Diagnosis not present

## 2022-03-10 DIAGNOSIS — E079 Disorder of thyroid, unspecified: Secondary | ICD-10-CM | POA: Diagnosis not present

## 2022-03-10 DIAGNOSIS — L570 Actinic keratosis: Secondary | ICD-10-CM

## 2022-03-10 NOTE — Progress Notes (Signed)
Follow-Up Visit   Subjective  Melissa Meadows is a 71 y.o. female who presents for the following: Follow-up (Patient here today for AK follow up at left anterior neck, treated with LN2. ).  Patient would like to ask about what she can do for hair loss. She noticed it about 7 years ago after starting medication for thyroid. She also has a white spot she noticed at left eyelid that she would like checked. No change, not bothersome.   The following portions of the chart were reviewed this encounter and updated as appropriate:   Tobacco  Allergies  Meds  Problems  Med Hx  Surg Hx  Fam Hx      Review of Systems:  No other skin or systemic complaints except as noted in HPI or Assessment and Plan.  Objective  Well appearing patient in no apparent distress; mood and affect are within normal limits.  A focused examination was performed including scalp, neck, face. Relevant physical exam findings are noted in the Assessment and Plan.  Residual at Left Anterior Neck Erythematous thin papules/macules with gritty scale.   Scalp Diffuse mild of the crown and widening of the midline part with retention of the frontal hairline. Also with positive hair pull test.              Assessment & Plan  AK (actinic keratosis) Residual at Left Anterior Neck  Actinic keratoses are precancerous spots that appear secondary to cumulative UV radiation exposure/sun exposure over time. They are chronic with expected duration over 1 year. A portion of actinic keratoses will progress to squamous cell carcinoma of the skin. It is not possible to reliably predict which spots will progress to skin cancer and so treatment is recommended to prevent development of skin cancer.  Recommend daily broad spectrum sunscreen SPF 30+ to sun-exposed areas, reapply every 2 hours as needed.  Recommend staying in the shade or wearing long sleeves, sun glasses (UVA+UVB protection) and wide brim hats (4-inch brim around  the entire circumference of the hat). Call for new or changing lesions.  Prior to procedure, discussed risks of blister formation, small wound, skin dyspigmentation, or rare scar following cryotherapy. Recommend Vaseline ointment to treated areas while healing.   Destruction of lesion - Residual at Left Anterior Neck  Destruction method: cryotherapy   Informed consent: discussed and consent obtained   Lesion destroyed using liquid nitrogen: Yes   Cryotherapy cycles:  2 Outcome: patient tolerated procedure well with no complications   Post-procedure details: wound care instructions given    Alopecia Scalp  C/w telogen effluvium plus androgenetic alopecia. Chronic and persistent condition with expected duration over one year. Condition is bothersome to patient. Not currently at goal.  No major illnesses or surgeries recently. She does take medication for her thyroid. Will order TSH, ferritin, vitamin D.  Recommend asking cardiologist if okay to start minoxidil 5% (Rogaine for men) solution or foam to be applied to the scalp and left in. This should ideally be used twice daily for best results but it helps with hair regrowth when used at least three times per week. Rogaine initially can cause increased hair shedding for the first few weeks but this will stop with continued use. In studies, people who used minoxidil (Rogaine) for at least 6 months had thicker hair than people who did not. Minoxidil topical (Rogaine) only works as long as it continues to be used. If if it is no longer used then the hair it has  been helping to regrow can fall out. Minoxidil topical (Rogaine) can cause increased facial hair growth.  Discussed prescription minoxidil (if ok per cardiology), dutasteride or finasteride. Deferred at this time.   Related Procedures TSH Ferritin Vitamin D, 25-Hydroxy, Total   Return for 4-6 month alopecia, AK follow up.  Graciella Belton, RMA, am acting as scribe for Forest Gleason, MD .  Documentation: I have reviewed the above documentation for accuracy and completeness, and I agree with the above.  Forest Gleason, MD

## 2022-03-10 NOTE — Patient Instructions (Addendum)
Cryotherapy Aftercare  Wash gently with soap and water everyday.   Apply Vaseline and Band-Aid daily until healed.    Recommend asking cardiologist if okay to start using minoxidil 5% (Rogaine for men) solution or foam to be applied to the scalp and left in. This should ideally be used twice daily for best results but it helps with hair regrowth when used at least three times per week. Rogaine initially can cause increased hair shedding for the first few weeks but this will stop with continued use. In studies, people who used minoxidil (Rogaine) for at least 6 months had thicker hair than people who did not. Minoxidil topical (Rogaine) only works as long as it continues to be used. If if it is no longer used then the hair it has been helping to regrow can fall out. Minoxidil topical (Rogaine) can cause increased facial hair growth.   Due to recent changes in healthcare laws, you may see results of your pathology and/or laboratory studies on MyChart before the doctors have had a chance to review them. We understand that in some cases there may be results that are confusing or concerning to you. Please understand that not all results are received at the same time and often the doctors may need to interpret multiple results in order to provide you with the best plan of care or course of treatment. Therefore, we ask that you please give [place] 2 business days to thoroughly review all your results before contacting the office for clarification. Should we see a critical lab result, you will be contacted sooner.   If You Need Anything After Your Visit  If you have any questions or concerns for your doctor, please call our main line at [phone] and press option 4 to reach your doctor's medical assistant. If no one answers, please leave a voicemail as directed and we will return your call as soon as possible. Messages left after 4 pm will be answered the following business day.   You may also send [place] a  message via [place]. We typically respond to MyChart messages within 1-2 business days.  For prescription refills, please ask your pharmacy to contact our office. Our fax number is [phone].  If you have an urgent issue when the clinic is closed that cannot wait until the next business day, you can page your doctor at the number below.    Please note that while we do our best to be available for urgent issues outside of office hours, we are not available 24/7.   If you have an urgent issue and are unable to reach [place], you may choose to seek medical care at your doctor's office, retail clinic, urgent care center, or emergency room.  If you have a medical emergency, please immediately call 911 or go to the emergency department.  Pager Numbers  - Dr. Nehemiah Massed: [phone]  - Dr. Laurence Ferrari: [phone]  - Dr. Nicole Kindred: [phone]  In the event of inclement weather, please call our main line at [phone] for an update on the status of any delays or closures.  Dermatology Medication Tips: Please keep the boxes that topical medications come in in order to help keep track of the instructions about where and how to use these. Pharmacies typically print the medication instructions only on the boxes and not directly on the medication tubes.   If your medication is too expensive, please contact our office at [phone] option 4 or send [place] a message through [place].   We are  unable to tell what your co-pay for medications will be in advance as this is different depending on your insurance coverage. However, we may be able to find a substitute medication at lower cost or fill out paperwork to get insurance to cover a needed medication.   If a prior authorization is required to get your medication covered by your insurance company, please allow [place] 1-2 business days to complete this process.  Drug prices often vary depending on where the prescription is filled and some pharmacies may offer  cheaper prices.  The website www.goodrx.com contains coupons for medications through different pharmacies. The prices here do not account for what the cost may be with help from insurance (it may be cheaper with your insurance), but the website can give you the price if you did not use any insurance.  - You can print the associated coupon and take it with your prescription to the pharmacy.  - You may also stop by our office during regular business hours and pick up a GoodRx coupon card.  - If you need your prescription sent electronically to a different pharmacy, notify our office through [facility] or by phone at [phone] option 4.     Si Usted Necesita Algo Despus de Su Visita  Tambin puede enviarnos un mensaje a travs de Pharmacist, community. Por lo general respondemos a los mensajes de MyChart en el transcurso de 1 a 2 das hbiles.  Para renovar recetas, por favor pida a su farmacia que se ponga en contacto con nuestra oficina. Harland Dingwall de fax es [place] [phone].  Si tiene un asunto urgente cuando la clnica est cerrada y que no puede esperar hasta el siguiente da hbil, puede llamar/localizar a su doctor(a) al nmero que aparece a continuacin.   Por favor, tenga en cuenta que aunque hacemos todo lo posible para estar disponibles para asuntos urgentes fuera del horario de [place], no estamos disponibles las 24 horas del da, los 7 das de la [place].   Si tiene un problema urgente y no puede comunicarse con nosotros, puede optar por buscar atencin mdica  en el consultorio de su doctor(a), en una clnica privada, en un centro de atencin urgente o en una sala de emergencias.  Si tiene Engineering geologist, por favor llame inmediatamente al 911 o vaya a la sala de emergencias.  Nmeros de bper  - Dr. Nehemiah Massed: [phone]  - Dra. Moye: [phone]  - Dra. Nicole Kindred: [phone]  En caso de inclemencias del [place], por favor llame a Johnsie Kindred principal al  [phone] para una actualizacin sobre el [place] de cualquier retraso o cierre.  Consejos para la medicacin en dermatologa: Por favor, guarde las cajas en las que vienen los medicamentos de uso tpico para ayudarle a seguir las instrucciones sobre dnde y cmo usarlos. Las farmacias generalmente imprimen las instrucciones del medicamento slo en las cajas y no directamente en los tubos del [place].   Si su medicamento es muy caro, por favor, pngase en contacto con Zigmund Daniel llamando al [phone] y presione la opcin 4 o envenos un mensaje a travs de Pharmacist, community.   No podemos decirle cul ser su copago por los medicamentos por adelantado ya que esto es diferente dependiendo de la cobertura de su seguro. Sin embargo, es posible que podamos encontrar un medicamento sustituto a Electrical engineer un formulario para que el seguro cubra el medicamento que se considera necesario.   Si se requiere una autorizacin previa para que su compaa de  seguros [place] su medicamento, por favor permtanos de 1 a 2 das hbiles para completar este proceso.  Los precios de los medicamentos varan con frecuencia dependiendo del Environmental consultant de dnde se surte la receta y alguna farmacias pueden ofrecer precios ms baratos.  El sitio web www.goodrx.com tiene cupones para medicamentos de Airline pilot. Los precios aqu no tienen en cuenta lo que podra costar con la ayuda del seguro (puede ser ms barato con su seguro), pero el sitio web puede darle el precio si no utiliz Research scientist (physical sciences).  - Puede imprimir el cupn correspondiente y llevarlo con su receta a la farmacia.  - Tambin puede pasar por nuestra oficina durante el horario de atencin regular y Charity fundraiser una tarjeta de cupones de GoodRx.  - Si necesita que su receta se enve electrnicamente a una farmacia diferente, informe a nuestra oficina a travs de MyChart de [place] o por telfono llamando al [phone] y presione la opcin 4.

## 2022-03-11 ENCOUNTER — Encounter: Payer: Self-pay | Admitting: Dermatology

## 2022-03-20 LAB — VITAMIN D, 25-HYDROXY, TOTAL: Vitamin D, 25-Hydroxy, Serum: 43 ng/mL

## 2022-03-20 LAB — FERRITIN: Ferritin: 75 ng/mL (ref 15–150)

## 2022-03-20 LAB — TSH: TSH: 3.78 u[IU]/mL (ref 0.450–4.500)

## 2022-03-21 ENCOUNTER — Telehealth: Payer: Self-pay

## 2022-03-21 NOTE — Telephone Encounter (Signed)
-----   Message from Alfonso Patten, MD sent at 03/21/2022  2:39 PM EDT ----- Labs normal, no treatment needed  MAs please call. Thank you!

## 2022-03-21 NOTE — Telephone Encounter (Signed)
Called patient. Discussed lab results. WLN. No Tx needed.

## 2022-06-09 DIAGNOSIS — E038 Other specified hypothyroidism: Secondary | ICD-10-CM | POA: Diagnosis not present

## 2022-06-09 DIAGNOSIS — E063 Autoimmune thyroiditis: Secondary | ICD-10-CM | POA: Diagnosis not present

## 2022-06-16 DIAGNOSIS — Q969 Turner's syndrome, unspecified: Secondary | ICD-10-CM | POA: Diagnosis not present

## 2022-06-16 DIAGNOSIS — M8588 Other specified disorders of bone density and structure, other site: Secondary | ICD-10-CM | POA: Diagnosis not present

## 2022-06-16 DIAGNOSIS — E038 Other specified hypothyroidism: Secondary | ICD-10-CM | POA: Diagnosis not present

## 2022-06-16 DIAGNOSIS — E063 Autoimmune thyroiditis: Secondary | ICD-10-CM | POA: Diagnosis not present

## 2022-06-16 DIAGNOSIS — M8580 Other specified disorders of bone density and structure, unspecified site: Secondary | ICD-10-CM | POA: Diagnosis not present

## 2022-06-22 DIAGNOSIS — H2513 Age-related nuclear cataract, bilateral: Secondary | ICD-10-CM | POA: Diagnosis not present

## 2022-06-22 DIAGNOSIS — H40053 Ocular hypertension, bilateral: Secondary | ICD-10-CM | POA: Diagnosis not present

## 2022-08-18 ENCOUNTER — Ambulatory Visit (INDEPENDENT_AMBULATORY_CARE_PROVIDER_SITE_OTHER): Payer: Medicare HMO | Admitting: Dermatology

## 2022-08-18 ENCOUNTER — Encounter: Payer: Self-pay | Admitting: Dermatology

## 2022-08-18 VITALS — BP 126/72 | HR 70

## 2022-08-18 DIAGNOSIS — I821 Thrombophlebitis migrans: Secondary | ICD-10-CM

## 2022-08-18 DIAGNOSIS — C44319 Basal cell carcinoma of skin of other parts of face: Secondary | ICD-10-CM | POA: Diagnosis not present

## 2022-08-18 DIAGNOSIS — L649 Androgenic alopecia, unspecified: Secondary | ICD-10-CM | POA: Diagnosis not present

## 2022-08-18 DIAGNOSIS — C4441 Basal cell carcinoma of skin of scalp and neck: Secondary | ICD-10-CM | POA: Diagnosis not present

## 2022-08-18 DIAGNOSIS — D489 Neoplasm of uncertain behavior, unspecified: Secondary | ICD-10-CM

## 2022-08-18 DIAGNOSIS — C4491 Basal cell carcinoma of skin, unspecified: Secondary | ICD-10-CM

## 2022-08-18 HISTORY — DX: Basal cell carcinoma of skin, unspecified: C44.91

## 2022-08-18 NOTE — Progress Notes (Signed)
   Follow-Up Visit   Subjective  Melissa Meadows is a 72 y.o. female who presents for the following. 4 - 6 month ak and alopecia follow up Patient reports a spot behind right ear and spot at left temple she would like checked.   Patient reports she is not that concerned with hair loss. Did have some labs checked but not interested in starting a new medication at this time.   The following portions of the chart were reviewed this encounter and updated as appropriate: medications, allergies, medical history  Review of Systems:  No other skin or systemic complaints except as noted in HPI or Assessment and Plan.  Objective  Well appearing patient in no apparent distress; mood and affect are within normal limits.  A focused examination was performed of the following areas: face, neck, scalp and hair  left neck 1.2 cm thin erythematous plaque   Left Temple 0.9 cm thin pink plaque    Assessment & Plan   Neoplasm of uncertain behavior (2) left neck  Skin / nail biopsy Type of biopsy: tangential   Informed consent: discussed and consent obtained   Patient was prepped and draped in usual sterile fashion: Area prepped with alcohol. Anesthesia: the lesion was anesthetized in a standard fashion   Anesthetic:  1% lidocaine w/ epinephrine 1-100,000 buffered w/ 8.4% NaHCO3 Instrument used: flexible razor blade   Hemostasis achieved with: pressure, aluminum chloride and electrodesiccation   Outcome: patient tolerated procedure well   Post-procedure details: wound care instructions given   Post-procedure details comment:  Ointment and small bandage applied  Specimen 1 - Surgical pathology Differential Diagnosis: R/o sccis vs bcc vs ak  Check Margins: No  Left Temple  Skin / nail biopsy Type of biopsy: tangential   Informed consent: discussed and consent obtained   Patient was prepped and draped in usual sterile fashion: Area prepped with alcohol. Anesthesia: the lesion was  anesthetized in a standard fashion   Anesthetic:  1% lidocaine w/ epinephrine 1-100,000 buffered w/ 8.4% NaHCO3 Instrument used: flexible razor blade   Hemostasis achieved with: pressure, aluminum chloride and electrodesiccation   Outcome: patient tolerated procedure well   Post-procedure details: wound care instructions given   Post-procedure details comment:  Ointment and small bandage applied  Specimen 2 - Surgical pathology Differential Diagnosis: R/o sccis vs bcc vs ak  Check Margins: No  R/o sccis vs bcc vs ak            ANDROGENETIC ALOPECIA (FEMALE PATTERN HAIR LOSS) Exam: Diffuse thinning of the crown and widening of the midline part with retention of the frontal hairline with previous positive hair pull test C/w telogen effluvium plus androgenetic alopecia. TSH, vit D, ferritin wnl Pt concerned about medication causing heart issues. Discussed po finasteride as option. Patient deferred treatment at this time.  Seborrheic Keratoses - Stuck-on, waxy, tan-brown papules and/or plaques  - Benign-appearing - Discussed benign etiology and prognosis. - Observe - Call for any changes   Return for 1 month tbse .  I, Ruthell Rummage, CMA, am acting as scribe for Forest Gleason, MD.   Documentation: I have reviewed the above documentation for accuracy and completeness, and I agree with the above.  Forest Gleason, MD

## 2022-08-18 NOTE — Patient Instructions (Addendum)
   Biopsy Wound Care Instructions  Leave the original bandage on for 24 hours if possible.  If the bandage becomes soaked or soiled before that time, it is OK to remove it and examine the wound.  A small amount of post-operative bleeding is normal.  If excessive bleeding occurs, remove the bandage, place gauze over the site and apply continuous pressure (no peeking) over the area for 30 minutes. If this does not work, please call our clinic as soon as possible or page your doctor if it is after hours.   Once a day, cleanse the wound with soap and water. It is fine to shower. If a thick crust develops you may use a Q-tip dipped into dilute hydrogen peroxide (mix 1:1 with water) to dissolve it.  Hydrogen peroxide can slow the healing process, so use it only as needed.    After washing, apply petroleum jelly (Vaseline) or an antibiotic ointment if your doctor prescribed one for you, followed by a bandage.    For best healing, the wound should be covered with a layer of ointment at all times. If you are not able to keep the area covered with a bandage to hold the ointment in place, this may mean re-applying the ointment several times a day.  Continue this wound care until the wound has healed and is no longer open.   Itching and mild discomfort is normal during the healing process. However, if you develop pain or severe itching, please call our office.   If you have any discomfort, you can take Tylenol (acetaminophen) or ibuprofen as directed on the bottle. (Please do not take these if you have an allergy to them or cannot take them for another reason).  Some redness, tenderness and white or yellow material in the wound is normal healing.  If the area becomes very sore and red, or develops a thick yellow-green material (pus), it may be infected; please notify us.    If you have stitches, return to clinic as directed to have the stitches removed. You will continue wound care for 2-3 days after the  stitches are removed.   Wound healing continues for up to one year following surgery. It is not unusual to experience pain in the scar from time to time during the interval.  If the pain becomes severe or the scar thickens, you should notify the office.    A slight amount of redness in a scar is expected for the first six months.  After six months, the redness will fade and the scar will soften and fade.  The color difference becomes less noticeable with time.  If there are any problems, return for a post-op surgery check at your earliest convenience.  To improve the appearance of the scar, you can use silicone scar gel, cream, or sheets (such as Mederma or Serica) every night for up to one year. These are available over the counter (without a prescription).  Please call our office at [phone] for any questions or concerns.     Due to recent changes in healthcare laws, you may see results of your pathology and/or laboratory studies on MyChart before the doctors have had a chance to review them. We understand that in some cases there may be results that are confusing or concerning to you. Please understand that not all results are received at the same time and often the doctors may need to interpret multiple results in order to provide you with the best plan of   care or course of treatment. Therefore, we ask that you please give us 2 business days to thoroughly review all your results before contacting the office for clarification. Should we see a critical lab result, you will be contacted sooner.   If You Need Anything After Your Visit  If you have any questions or concerns for your doctor, please call our main line at [phone] and press option 4 to reach your doctor's medical assistant. If no one answers, please leave a voicemail as directed and we will return your call as soon as possible. Messages left after 4 pm will be answered the following business day.   You may also send us a  message via MyChart. We typically respond to MyChart messages within 1-2 business days.  For prescription refills, please ask your pharmacy to contact our office. Our fax number is [phone].  If you have an urgent issue when the clinic is closed that cannot wait until the next business day, you can page your doctor at the number below.    Please note that while we do our best to be available for urgent issues outside of office hours, we are not available 24/7.   If you have an urgent issue and are unable to reach us, you may choose to seek medical care at your doctor's office, retail clinic, urgent care center, or emergency room.  If you have a medical emergency, please immediately call 911 or go to the emergency department.  Pager Numbers  - Dr. Kowalski: [phone]  - Dr. Moye: [phone]  - Dr. Stewart: [phone]  In the event of inclement weather, please call our main line at [phone] for an update on the status of any delays or closures.  Dermatology Medication Tips: Please keep the boxes that topical medications come in in order to help keep track of the instructions about where and how to use these. Pharmacies typically print the medication instructions only on the boxes and not directly on the medication tubes.   If your medication is too expensive, please contact our office at [phone] option 4 or send us a message through MyChart.   We are unable to tell what your co-pay for medications will be in advance as this is different depending on your insurance coverage. However, we may be able to find a substitute medication at lower cost or fill out paperwork to get insurance to cover a needed medication.   If a prior authorization is required to get your medication covered by your insurance company, please allow us 1-2 business days to complete this process.  Drug prices often vary depending on where the prescription is filled and some pharmacies may offer  cheaper prices.  The website www.goodrx.com contains coupons for medications through different pharmacies. The prices here do not account for what the cost may be with help from insurance (it may be cheaper with your insurance), but the website can give you the price if you did not use any insurance.  - You can print the associated coupon and take it with your prescription to the pharmacy.  - You may also stop by our office during regular business hours and pick up a GoodRx coupon card.  - If you need your prescription sent electronically to a different pharmacy, notify our office through [place] MyChart or by phone at [phone] option 4.     Si Usted Necesita Algo Despus de Su Visita  Tambin puede enviarnos un mensaje a travs de MyChart. Por lo general   respondemos a los mensajes de MyChart en el transcurso de 1 a 2 das hbiles.  Para renovar recetas, por favor pida a su farmacia que se ponga en contacto con nuestra oficina. Nuestro nmero de fax es el [phone].  Si tiene un asunto urgente cuando la clnica est cerrada y que no puede esperar hasta el siguiente da hbil, puede llamar/localizar a su doctor(a) al nmero que aparece a continuacin.   Por favor, tenga en cuenta que aunque hacemos todo lo posible para estar disponibles para asuntos urgentes fuera del horario de oficina, no estamos disponibles las 24 horas del da, los 7 das de la semana.   Si tiene un problema urgente y no puede comunicarse con nosotros, puede optar por buscar atencin mdica  en el consultorio de su doctor(a), en una clnica privada, en un centro de atencin urgente o en una sala de emergencias.  Si tiene una emergencia mdica, por favor llame inmediatamente al 911 o vaya a la sala de emergencias.  Nmeros de bper  - Dr. Kowalski: [phone]  - Dra. Moye: [phone]  - Dra. Stewart: [phone]  En caso de inclemencias del tiempo, por favor llame a nuestra lnea principal al  [phone] para una actualizacin sobre el estado de cualquier retraso o cierre.  Consejos para la medicacin en dermatologa: Por favor, guarde las cajas en las que vienen los medicamentos de uso tpico para ayudarle a seguir las instrucciones sobre dnde y cmo usarlos. Las farmacias generalmente imprimen las instrucciones del medicamento slo en las cajas y no directamente en los tubos del medicamento.   Si su medicamento es muy caro, por favor, pngase en contacto con nuestra oficina llamando al [phone] y presione la opcin 4 o envenos un mensaje a travs de MyChart.   No podemos decirle cul ser su copago por los medicamentos por adelantado ya que esto es diferente dependiendo de la cobertura de su seguro. Sin embargo, es posible que podamos encontrar un medicamento sustituto a menor costo o llenar un formulario para que el seguro cubra el medicamento que se considera necesario.   Si se requiere una autorizacin previa para que su compaa de seguros cubra su medicamento, por favor permtanos de 1 a 2 das hbiles para completar este proceso.  Los precios de los medicamentos varan con frecuencia dependiendo del lugar de dnde se surte la receta y alguna farmacias pueden ofrecer precios ms baratos.  El sitio web www.goodrx.com tiene cupones para medicamentos de diferentes farmacias. Los precios aqu no tienen en cuenta lo que podra costar con la ayuda del seguro (puede ser ms barato con su seguro), pero el sitio web puede darle el precio si no utiliz ningn seguro.  - Puede imprimir el cupn correspondiente y llevarlo con su receta a la farmacia.  - Tambin puede pasar por nuestra oficina durante el horario de atencin regular y recoger una tarjeta de cupones de GoodRx.  - Si necesita que su receta se enve electrnicamente a una farmacia diferente, informe a nuestra oficina a travs de MyChart de [place] o por telfono llamando al [phone] y presione la opcin 4.  

## 2022-08-25 ENCOUNTER — Other Ambulatory Visit: Payer: Self-pay

## 2022-08-25 DIAGNOSIS — C44319 Basal cell carcinoma of skin of other parts of face: Secondary | ICD-10-CM

## 2022-09-01 DIAGNOSIS — I10 Essential (primary) hypertension: Secondary | ICD-10-CM | POA: Diagnosis not present

## 2022-09-01 DIAGNOSIS — Z0000 Encounter for general adult medical examination without abnormal findings: Secondary | ICD-10-CM | POA: Diagnosis not present

## 2022-09-01 DIAGNOSIS — E038 Other specified hypothyroidism: Secondary | ICD-10-CM | POA: Diagnosis not present

## 2022-09-01 DIAGNOSIS — E063 Autoimmune thyroiditis: Secondary | ICD-10-CM | POA: Diagnosis not present

## 2022-09-08 DIAGNOSIS — I10 Essential (primary) hypertension: Secondary | ICD-10-CM | POA: Diagnosis not present

## 2022-09-08 DIAGNOSIS — E063 Autoimmune thyroiditis: Secondary | ICD-10-CM | POA: Diagnosis not present

## 2022-09-08 DIAGNOSIS — Z1331 Encounter for screening for depression: Secondary | ICD-10-CM | POA: Diagnosis not present

## 2022-09-08 DIAGNOSIS — Z0000 Encounter for general adult medical examination without abnormal findings: Secondary | ICD-10-CM | POA: Diagnosis not present

## 2022-09-13 DIAGNOSIS — C44319 Basal cell carcinoma of skin of other parts of face: Secondary | ICD-10-CM | POA: Diagnosis not present

## 2022-09-20 ENCOUNTER — Ambulatory Visit (INDEPENDENT_AMBULATORY_CARE_PROVIDER_SITE_OTHER): Payer: Medicare HMO | Admitting: Dermatology

## 2022-09-20 ENCOUNTER — Encounter: Payer: Self-pay | Admitting: Dermatology

## 2022-09-20 VITALS — BP 134/69 | HR 72

## 2022-09-20 DIAGNOSIS — C4441 Basal cell carcinoma of skin of scalp and neck: Secondary | ICD-10-CM | POA: Diagnosis not present

## 2022-09-20 DIAGNOSIS — D492 Neoplasm of unspecified behavior of bone, soft tissue, and skin: Secondary | ICD-10-CM

## 2022-09-20 DIAGNOSIS — C44519 Basal cell carcinoma of skin of other part of trunk: Secondary | ICD-10-CM

## 2022-09-20 DIAGNOSIS — C4491 Basal cell carcinoma of skin, unspecified: Secondary | ICD-10-CM

## 2022-09-20 HISTORY — DX: Basal cell carcinoma of skin, unspecified: C44.91

## 2022-09-20 NOTE — Patient Instructions (Addendum)
Change Duoderm dressing on back in 3 days  Wound Care Instructions  Cleanse wound gently with soap and water once a day then pat dry with clean gauze. Apply a thin coat of Petrolatum (petroleum jelly, "Vaseline") over the wound (unless you have an allergy to this). We recommend that you use a new, sterile tube of Vaseline. Do not pick or remove scabs. Do not remove the yellow or white "healing tissue" from the base of the wound.  Cover the wound with fresh, clean, nonstick gauze and secure with paper tape. You may use Band-Aids in place of gauze and tape if the wound is small enough, but would recommend trimming much of the tape off as there is often too much. Sometimes Band-Aids can irritate the skin.  You should call the office for your biopsy report after 1 week if you have not already been contacted.  If you experience any problems, such as abnormal amounts of bleeding, swelling, significant bruising, significant pain, or evidence of infection, please call the office immediately.  FOR ADULT SURGERY PATIENTS: If you need something for pain relief you may take 1 extra strength Tylenol (acetaminophen) AND 2 Ibuprofen (  each) together every 4 hours as needed for pain. (do not take these if you are allergic to them or if you have a reason you should not take them.) Typically, you may only need pain medication for 1 to 3 days.     Recommend daily broad spectrum sunscreen SPF 30+ to sun-exposed areas, reapply every 2 hours as needed. Call for new or changing lesions.  Staying in the shade or wearing long sleeves, sun glasses (UVA+UVB protection) and wide brim hats (4-inch brim around the entire circumference of the hat) are also recommended for sun protection.   Recommend taking Heliocare sun protection supplement daily in sunny weather for additional sun protection. For maximum protection on the sunniest days, you can take up to 2 capsules of regular Heliocare OR take 1 capsule of Heliocare  Ultra. For prolonged exposure (such as a full day in the sun), you can repeat your dose of the supplement 4 hours after your first dose. Heliocare can be purchased at [employer], at some [facility] or at [email or web address].     Due to recent changes in healthcare laws, you may see results of your pathology and/or laboratory studies on MyChart before the doctors have had a chance to review them. We understand that in some cases there may be results that are confusing or concerning to you. Please understand that not all results are received at the same time and often the doctors may need to interpret multiple results in order to provide you with the best plan of care or course of treatment. Therefore, we ask that you please give [place] 2 business days to thoroughly review all your results before contacting the office for clarification. Should we see a critical lab result, you will be contacted sooner.   If You Need Anything After Your Visit  If you have any questions or concerns for your doctor, please call our main line at [phone] and press option 4 to reach your doctor's medical assistant. If no one answers, please leave a voicemail as directed and we will return your call as soon as possible. Messages left after 4 pm will be answered the following business day.   You may also send [place] a message via MyChart. We typically respond to MyChart messages within 1-2 business days.  For prescription refills, please ask your  pharmacy to contact our office. Our fax number is [phone].  If you have an urgent issue when the clinic is closed that cannot wait until the next business day, you can page your doctor at the number below.    Please note that while we do our best to be available for urgent issues outside of office hours, we are not available 24/7.   If you have an urgent issue and are unable to reach [place], you may choose to seek medical care at your doctor's office, retail clinic, urgent care  center, or emergency room.  If you have a medical emergency, please immediately call 911 or go to the emergency department.  Pager Numbers  - Dr. Gwen Pounds: [phone]  - Dr. Neale Burly: [phone]  - Dr. Roseanne Reno: [phone]  In the event of inclement weather, please call our main line at [phone] for an update on the status of any delays or closures.  Dermatology Medication Tips: Please keep the boxes that topical medications come in in order to help keep track of the instructions about where and how to use these. Pharmacies typically print the medication instructions only on the boxes and not directly on the medication tubes.   If your medication is too expensive, please contact our office at [phone] option 4 or send [place] a message through MyChart.   We are unable to tell what your co-pay for medications will be in advance as this is different depending on your insurance coverage. However, we may be able to find a substitute medication at lower cost or fill out paperwork to get insurance to cover a needed medication.   If a prior authorization is required to get your medication covered by your insurance company, please allow [place] 1-2 business days to complete this process.  Drug prices often vary depending on where the prescription is filled and some pharmacies may offer cheaper prices.  The website www.goodrx.com contains coupons for medications through different pharmacies. The prices here do not account for what the cost may be with help from insurance (it may be cheaper with your insurance), but the website can give you the price if you did not use any insurance.  - You can print the associated coupon and take it with your prescription to the pharmacy.  - You may also stop by our office during regular business hours and pick up a GoodRx coupon card.  - If you need your prescription sent electronically to a different pharmacy, notify our office through [facility] or by  phone at [phone] option 4.     Si Usted Necesita Algo Despus de Su Visita  Tambin puede enviarnos un mensaje a travs de Clinical cytogeneticist. Por lo general respondemos a los mensajes de MyChart en el transcurso de 1 a 2 das hbiles.  Para renovar recetas, por favor pida a su farmacia que se ponga en contacto con nuestra oficina. Annie Sable de fax es [place] [phone].  Si tiene un asunto urgente cuando la clnica est cerrada y que no puede esperar hasta el siguiente da hbil, puede llamar/localizar a su doctor(a) al nmero que aparece a continuacin.   Por favor, tenga en cuenta que aunque hacemos todo lo posible para estar disponibles para asuntos urgentes fuera del horario de [place], no estamos disponibles las 24 horas del da, los 7 [address]  [address] de la [place].   Si tiene un problema urgente y no puede comunicarse con nosotros, puede optar por buscar atencin mdica  en el consultorio de su doctor(a), en  una clnica privada, en un centro de atencin urgente o en una sala de emergencias.  Si tiene Engineer, drilling, por favor llame inmediatamente al 911 o vaya a la sala de emergencias.  Nmeros de bper  - Dr. Gwen Pounds: [phone]  - Dra. Moye: [phone]  - Dra. Roseanne Reno: [phone]  En caso de inclemencias del [place], por favor llame a Lacy Duverney principal al [phone] para una actualizacin sobre el [place] de cualquier retraso o cierre.  Consejos para la medicacin en dermatologa: Por favor, guarde las cajas en las que vienen los medicamentos de uso tpico para ayudarle a seguir las instrucciones sobre dnde y cmo usarlos. Las farmacias generalmente imprimen las instrucciones del medicamento slo en las cajas y no directamente en los tubos del [place].   Si su medicamento es muy caro, por favor, pngase en contacto con Rolm Gala llamando al [phone] y presione la opcin 4 o envenos un mensaje a travs de Clinical cytogeneticist.   No podemos decirle cul ser su copago  por los medicamentos por adelantado ya que esto es diferente dependiendo de la cobertura de su seguro. Sin embargo, es posible que podamos encontrar un medicamento sustituto a Audiological scientist un formulario para que el seguro cubra el medicamento que se considera necesario.   Si se requiere una autorizacin previa para que su compaa de seguros [place] su medicamento, por favor permtanos de 1 a 2 das hbiles para completar [address]  Los precios de los medicamentos varan con frecuencia dependiendo del Environmental consultant de dnde se surte la receta y alguna farmacias pueden ofrecer precios ms baratos.  El sitio web www.goodrx.com tiene cupones para medicamentos de Health and safety inspector. Los precios aqu no tienen en cuenta lo que podra costar con la ayuda del seguro (puede ser ms barato con su seguro), pero el sitio web puede darle el precio si no utiliz Tourist information centre manager.  - Puede imprimir el cupn correspondiente y llevarlo con su receta a la farmacia.  - Tambin puede pasar por nuestra oficina durante el horario de atencin regular y Education officer, museum una tarjeta de cupones de GoodRx.  - Si necesita que su receta se enve electrnicamente a una farmacia diferente, informe a nuestra oficina a travs de MyChart de [place] o por telfono llamando al [phone] y presione la opcin 4.

## 2022-09-20 NOTE — Progress Notes (Signed)
Follow-Up Visit   Subjective  Melissa Meadows is a 72 y.o. female who presents for the following: BCC at left neck. Here for recheck and discuss treatment options. Patient strongly defers full skin exam today.  The patient has spots, moles and lesions to be evaluated, some may be new or changing and the patient has concerns that these could be cancer.    The following portions of the chart were reviewed this encounter and updated as appropriate: medications, allergies, medical history  Review of Systems:  No other skin or systemic complaints except as noted in HPI or Assessment and Plan.  Objective  Well appearing patient in no apparent distress; mood and affect are within normal limits.  A focused examination was performed of the following areas: Left neck, back  Relevant physical exam findings are noted in the Assessment and Plan.  Right Upper Back 0.6 cm scaly pink papule       Left Lateral Neck Pink healing biopsy site    Assessment & Plan   Neoplasm of skin Right Upper Back  Epidermal / dermal shaving  Lesion diameter (cm):  0.6 Informed consent: discussed and consent obtained   Patient was prepped and draped in usual sterile fashion: Area prepped with alcohol. Anesthesia: the lesion was anesthetized in a standard fashion   Anesthetic:  1% lidocaine w/ epinephrine 1-100,000 buffered w/ 8.4% NaHCO3 Instrument used: flexible razor blade   Hemostasis achieved with: pressure, aluminum chloride and electrodesiccation   Outcome: patient tolerated procedure well   Post-procedure details: wound care instructions given   Post-procedure details comment:  Ointment and small bandage applied  Destruction of lesion  Destruction method: electrodesiccation and curettage   Informed consent: discussed and consent obtained   Timeout:  patient name, date of birth, surgical site, and procedure verified Anesthesia: the lesion was anesthetized in a standard fashion    Anesthetic:  1% lidocaine w/ epinephrine 1-100,000 buffered w/ 8.4% NaHCO3 Curettage performed in three different directions: Yes   Electrodesiccation performed over the curetted area: Yes   Curettage cycles:  3 Lesion length (cm):  0.6 Lesion width (cm):  0.6 Margin per side (cm):  0.4 Final wound size (cm):  1.4 Hemostasis achieved with:  electrodesiccation Outcome: patient tolerated procedure well with no complications   Post-procedure details: sterile dressing applied and wound care instructions given   Dressing type: petrolatum    Specimen 1 - Surgical pathology Differential Diagnosis: R/O SCCis vs BCC   Check Margins: No  Basal cell carcinoma (BCC) of skin of neck Left Lateral Neck  Destruction of lesion  Destruction method: electrodesiccation and curettage   Informed consent: discussed and consent obtained   Timeout:  patient name, date of birth, surgical site, and procedure verified Anesthesia: the lesion was anesthetized in a standard fashion   Anesthetic:  1% lidocaine w/ epinephrine 1-100,000 buffered w/ 8.4% NaHCO3 Curettage performed in three different directions: Yes   Electrodesiccation performed over the curetted area: Yes   Curettage cycles:  3 Final wound size (cm):  2.4 Hemostasis achieved with:  electrodesiccation Outcome: patient tolerated procedure well with no complications   Post-procedure details: sterile dressing applied and wound care instructions given   Dressing type: petrolatum       Return for [facility] rescheck and TBSE in 3-4 months.  I, Lawson Radar, CMA, am acting as scribe for Darden Dates, MD.   Documentation: I have reviewed the above documentation for accuracy and completeness, and I agree with the above.  VIRGINA  Laurence Ferrari, MD

## 2022-09-26 ENCOUNTER — Telehealth: Payer: Self-pay

## 2022-09-26 NOTE — Telephone Encounter (Signed)
-----   Message from Sandi Mealy, MD sent at 09/22/2022 11:03 PM EDT ----- Skin , right upper back BASAL CELL CARCINOMA, SUPERFICIAL AND NODULAR PATTERNS --> already treated with [facility], recheck at f/u  MAs please call. Thank you!

## 2022-09-26 NOTE — Telephone Encounter (Addendum)
Called and spoke with patient regarding results. She verbalized understanding and denied further questions. Will recheck at next follow up   ----- Message from Sandi Mealy, MD sent at 09/22/2022 11:03 PM EDT ----- Skin , right upper back BASAL CELL CARCINOMA, SUPERFICIAL AND NODULAR PATTERNS --> already treated with [facility], recheck at f/u  MAs please call. Thank you!

## 2022-12-29 DIAGNOSIS — H40053 Ocular hypertension, bilateral: Secondary | ICD-10-CM | POA: Diagnosis not present

## 2022-12-29 DIAGNOSIS — H40033 Anatomical narrow angle, bilateral: Secondary | ICD-10-CM | POA: Diagnosis not present

## 2022-12-29 DIAGNOSIS — H2513 Age-related nuclear cataract, bilateral: Secondary | ICD-10-CM | POA: Diagnosis not present

## 2023-01-19 ENCOUNTER — Ambulatory Visit: Payer: Medicare HMO | Admitting: Dermatology

## 2023-01-19 DIAGNOSIS — L814 Other melanin hyperpigmentation: Secondary | ICD-10-CM

## 2023-01-19 DIAGNOSIS — Z85828 Personal history of other malignant neoplasm of skin: Secondary | ICD-10-CM

## 2023-01-19 DIAGNOSIS — Z1283 Encounter for screening for malignant neoplasm of skin: Secondary | ICD-10-CM

## 2023-01-19 DIAGNOSIS — L820 Inflamed seborrheic keratosis: Secondary | ICD-10-CM | POA: Diagnosis not present

## 2023-01-19 DIAGNOSIS — L578 Other skin changes due to chronic exposure to nonionizing radiation: Secondary | ICD-10-CM | POA: Diagnosis not present

## 2023-01-19 NOTE — Patient Instructions (Addendum)

## 2023-01-19 NOTE — Progress Notes (Signed)
   Follow-Up Visit   Subjective  Melissa Meadows is a 72 y.o. female who presents for the following: Skin Cancer Screening and Full Body Skin Exam, hx of BCC  The patient presents for Total-Body Skin Exam (TBSE) for skin cancer screening and mole check. The patient has spots, moles and lesions to be evaluated, some may be new or changing and the patient may have concern these could be cancer.    The following portions of the chart were reviewed this encounter and updated as appropriate: medications, allergies, medical history  Review of Systems:  No other skin or systemic complaints except as noted in HPI or Assessment and Plan.  Objective  Well appearing patient in no apparent distress; mood and affect are within normal limits.  A full examination was performed including scalp, head, eyes, ears, nose, lips, neck, chest, axillae, abdomen, back, buttocks, bilateral upper extremities, bilateral lower extremities, hands, fingers, and fingernails. All findings within normal limits unless otherwise noted below.   Relevant physical exam findings are noted in the Assessment and Plan.  left temple x 1, left neck x 1, upper back x 1 (3) Stuck-on, waxy, tan-brown papules and plaques -- Discussed benign etiology and prognosis.     Assessment & Plan   SKIN CANCER SCREENING PERFORMED TODAY.  ACTINIC DAMAGE - Chronic condition, secondary to cumulative UV/sun exposure - diffuse scaly erythematous macules with underlying dyspigmentation - Recommend daily broad spectrum sunscreen SPF 30+ to sun-exposed areas, reapply every 2 hours as needed.  - Staying in the shade or wearing long sleeves, sun glasses (UVA+UVB protection) and wide brim hats (4-inch brim around the entire circumference of the hat) are also recommended for sun protection.  - Call for new or changing lesions.  LENTIGINES, SEBORRHEIC KERATOSES, HEMANGIOMAS - Benign normal skin lesions - Benign-appearing - Call for any  changes  MELANOCYTIC NEVI - Tan-brown and/or pink-flesh-colored symmetric macules and papules - Benign appearing on exam today - Observation - Call clinic for new or changing moles - Recommend daily use of broad spectrum spf 30+ sunscreen to sun-exposed areas.   HISTORY OF BASAL CELL CARCINOMA OF THE SKIN - No evidence of recurrence today - Recommend regular full body skin exams - Recommend daily broad spectrum sunscreen SPF 30+ to sun-exposed areas, reapply every 2 hours as needed.  - Call if any new or changing lesions are noted between office visits   Inflamed seborrheic keratosis (3) left temple x 1, left neck x 1, upper back x 1  Symptomatic, irritating, patient would like treated.   Destruction of lesion - left temple x 1, left neck x 1, upper back x 1 (3) Complexity: simple   Destruction method: cryotherapy   Informed consent: discussed and consent obtained   Timeout:  patient name, date of birth, surgical site, and procedure verified Lesion destroyed using liquid nitrogen: Yes   Region frozen until ice ball extended beyond lesion: Yes   Outcome: patient tolerated procedure well with no complications   Post-procedure details: wound care instructions given     Return in about 1 year (around 01/19/2024) for TBSE, hx of BCC.  IAngelique Holm, CMA, am acting as scribe for Armida Sans, MD .   Documentation: I have reviewed the above documentation for accuracy and completeness, and I agree with the above.  Armida Sans, MD

## 2023-01-27 ENCOUNTER — Encounter: Payer: Self-pay | Admitting: Dermatology

## 2023-06-09 DIAGNOSIS — E063 Autoimmune thyroiditis: Secondary | ICD-10-CM | POA: Diagnosis not present

## 2023-06-19 DIAGNOSIS — E063 Autoimmune thyroiditis: Secondary | ICD-10-CM | POA: Diagnosis not present

## 2023-06-19 DIAGNOSIS — Q969 Turner's syndrome, unspecified: Secondary | ICD-10-CM | POA: Diagnosis not present

## 2023-06-19 DIAGNOSIS — M8580 Other specified disorders of bone density and structure, unspecified site: Secondary | ICD-10-CM | POA: Diagnosis not present

## 2023-07-05 DIAGNOSIS — H40033 Anatomical narrow angle, bilateral: Secondary | ICD-10-CM | POA: Diagnosis not present

## 2023-07-05 DIAGNOSIS — H40053 Ocular hypertension, bilateral: Secondary | ICD-10-CM | POA: Diagnosis not present

## 2023-07-05 DIAGNOSIS — H2513 Age-related nuclear cataract, bilateral: Secondary | ICD-10-CM | POA: Diagnosis not present

## 2023-07-05 DIAGNOSIS — Z0100 Encounter for examination of eyes and vision without abnormal findings: Secondary | ICD-10-CM | POA: Diagnosis not present

## 2023-07-06 DIAGNOSIS — H52223 Regular astigmatism, bilateral: Secondary | ICD-10-CM | POA: Diagnosis not present

## 2023-07-06 DIAGNOSIS — H524 Presbyopia: Secondary | ICD-10-CM | POA: Diagnosis not present

## 2023-09-05 DIAGNOSIS — Z0000 Encounter for general adult medical examination without abnormal findings: Secondary | ICD-10-CM | POA: Diagnosis not present

## 2023-09-05 DIAGNOSIS — E538 Deficiency of other specified B group vitamins: Secondary | ICD-10-CM | POA: Diagnosis not present

## 2023-09-05 DIAGNOSIS — I10 Essential (primary) hypertension: Secondary | ICD-10-CM | POA: Diagnosis not present

## 2023-09-12 DIAGNOSIS — Z0000 Encounter for general adult medical examination without abnormal findings: Secondary | ICD-10-CM | POA: Diagnosis not present

## 2023-09-12 DIAGNOSIS — E063 Autoimmune thyroiditis: Secondary | ICD-10-CM | POA: Diagnosis not present

## 2023-09-12 DIAGNOSIS — I10 Essential (primary) hypertension: Secondary | ICD-10-CM | POA: Diagnosis not present

## 2023-09-12 DIAGNOSIS — E538 Deficiency of other specified B group vitamins: Secondary | ICD-10-CM | POA: Diagnosis not present

## 2023-09-12 DIAGNOSIS — Z1331 Encounter for screening for depression: Secondary | ICD-10-CM | POA: Diagnosis not present

## 2024-01-04 DIAGNOSIS — H2513 Age-related nuclear cataract, bilateral: Secondary | ICD-10-CM | POA: Diagnosis not present

## 2024-01-04 DIAGNOSIS — Z0100 Encounter for examination of eyes and vision without abnormal findings: Secondary | ICD-10-CM | POA: Diagnosis not present

## 2024-01-04 DIAGNOSIS — H40053 Ocular hypertension, bilateral: Secondary | ICD-10-CM | POA: Diagnosis not present

## 2024-01-04 DIAGNOSIS — H43813 Vitreous degeneration, bilateral: Secondary | ICD-10-CM | POA: Diagnosis not present

## 2024-01-04 DIAGNOSIS — H40033 Anatomical narrow angle, bilateral: Secondary | ICD-10-CM | POA: Diagnosis not present

## 2024-02-06 ENCOUNTER — Ambulatory Visit (INDEPENDENT_AMBULATORY_CARE_PROVIDER_SITE_OTHER): Payer: Medicare HMO | Admitting: Dermatology

## 2024-02-06 DIAGNOSIS — L910 Hypertrophic scar: Secondary | ICD-10-CM

## 2024-02-06 DIAGNOSIS — W908XXA Exposure to other nonionizing radiation, initial encounter: Secondary | ICD-10-CM | POA: Diagnosis not present

## 2024-02-06 DIAGNOSIS — L918 Other hypertrophic disorders of the skin: Secondary | ICD-10-CM

## 2024-02-06 DIAGNOSIS — D485 Neoplasm of uncertain behavior of skin: Secondary | ICD-10-CM | POA: Diagnosis not present

## 2024-02-06 DIAGNOSIS — L578 Other skin changes due to chronic exposure to nonionizing radiation: Secondary | ICD-10-CM

## 2024-02-06 DIAGNOSIS — L814 Other melanin hyperpigmentation: Secondary | ICD-10-CM | POA: Diagnosis not present

## 2024-02-06 DIAGNOSIS — L813 Cafe au lait spots: Secondary | ICD-10-CM | POA: Diagnosis not present

## 2024-02-06 DIAGNOSIS — D229 Melanocytic nevi, unspecified: Secondary | ICD-10-CM

## 2024-02-06 DIAGNOSIS — L821 Other seborrheic keratosis: Secondary | ICD-10-CM | POA: Diagnosis not present

## 2024-02-06 DIAGNOSIS — D1801 Hemangioma of skin and subcutaneous tissue: Secondary | ICD-10-CM

## 2024-02-06 DIAGNOSIS — C44519 Basal cell carcinoma of skin of other part of trunk: Secondary | ICD-10-CM | POA: Diagnosis not present

## 2024-02-06 DIAGNOSIS — D2271 Melanocytic nevi of right lower limb, including hip: Secondary | ICD-10-CM | POA: Diagnosis not present

## 2024-02-06 DIAGNOSIS — Z1283 Encounter for screening for malignant neoplasm of skin: Secondary | ICD-10-CM

## 2024-02-06 DIAGNOSIS — Z85828 Personal history of other malignant neoplasm of skin: Secondary | ICD-10-CM

## 2024-02-06 NOTE — Progress Notes (Signed)
 Follow-Up Visit   Subjective  Melissa Meadows is a 73 y.o. female who presents for the following: Skin Cancer Screening and Full Body Skin Exam  The patient presents for Total-Body Skin Exam (TBSE) for skin cancer screening and mole check. The patient has spots, moles and lesions to be evaluated, some may be new or changing. She has an itchy spot on the upper back and a tag near the left eye. History of BCCs.   The following portions of the chart were reviewed this encounter and updated as appropriate: medications, allergies, medical history  Review of Systems:  No other skin or systemic complaints except as noted in HPI or Assessment and Plan.  Objective  Well appearing patient in no apparent distress; mood and affect are within normal limits.  A full examination was performed including scalp, head, eyes, ears, nose, lips, neck, chest, axillae, abdomen, back, buttocks, bilateral upper extremities, bilateral lower extremities, hands, feet, fingers, toes, fingernails, and toenails. All findings within normal limits unless otherwise noted below.   Relevant physical exam findings are noted in the Assessment and Plan.  Right lower clavicle 0.6 CM pink tan pearly papule   Assessment & Plan   SKIN CANCER SCREENING PERFORMED TODAY.  ACTINIC DAMAGE - Chronic condition, secondary to cumulative UV/sun exposure - diffuse scaly erythematous macules with underlying dyspigmentation - Recommend daily broad spectrum sunscreen SPF 30+ to sun-exposed areas, reapply every 2 hours as needed.  - Staying in the shade or wearing long sleeves, sun glasses (UVA+UVB protection) and wide brim hats (4-inch brim around the entire circumference of the hat) are also recommended for sun protection.  - Call for new or changing lesions.  LENTIGINES, SEBORRHEIC KERATOSES, HEMANGIOMAS - Benign normal skin lesions - Benign-appearing - Call for any changes  SEBORRHEIC KERATOSIS - Left eyebrow with 1.5 cm waxy tan  patch; waxy tan papules at R popliteal, R postauricular crease - Benign-appearing - Discussed benign etiology and prognosis. - Observe - Call for any changes  MELANOCYTIC NEVI - Tan-brown and/or pink-flesh-colored symmetric macules and papules - Right lower calf- 5 mm tan macule - Benign appearing on exam today - Observation - Call clinic for new or changing moles - Recommend daily use of broad spectrum spf 30+ sunscreen to sun-exposed areas.   Cafe au Lait  - Tan patch at right posterior thigh - Genetic - Benign, observe - Call for any changes  HISTORY OF BASAL CELL CARCINOMA OF THE SKIN Right upper back, EDC 09/20/22 Left neck, EDC 09/20/22 Left temple, Mohs 09/13/22 - No evidence of recurrence today - Recommend regular full body skin exams - Recommend daily broad spectrum sunscreen SPF 30+ to sun-exposed areas, reapply every 2 hours as needed.  - Call if any new or changing lesions are noted between office visits   HYPERTROPHIC SCAR with pruritus Exam: hypopigmented firm plaque at the right upper back, BCC/EDC site.  Benign-appearing.  Call clinic for new or changing lesions. Discussed option of intralesional triamcinolone if this becomes bothersome. Patient defers today.  Treatment Plan: Observe.  Acrochordons (Skin Tags) - Fleshy, skin-colored pedunculated papule at left infraocular - Benign appearing.  - Observe. - If desired, they can be removed with an in office procedure that is not covered by insurance. - Please call the clinic if you notice any new or changing lesions.   NEOPLASM OF UNCERTAIN BEHAVIOR OF SKIN Right lower clavicle Epidermal / dermal shaving  Lesion diameter (cm):  0.6 Informed consent: discussed and consent obtained  Patient was prepped and draped in usual sterile fashion: Area prepped with alcohol. Anesthesia: the lesion was anesthetized in a standard fashion   Anesthetic:  1% lidocaine w/ epinephrine 1-100,000 buffered w/ 8.4%  NaHCO3 Instrument used: flexible razor blade   Hemostasis achieved with: pressure, aluminum chloride and electrodesiccation   Outcome: patient tolerated procedure well    Destruction of lesion  Destruction method: electrodesiccation and curettage   Informed consent: discussed and consent obtained   Curettage performed in three different directions: Yes   Electrodesiccation performed over the curetted area: Yes   Final wound size (cm):  0.7 Hemostasis achieved with:  pressure, aluminum chloride and electrodesiccation Outcome: patient tolerated procedure well with no complications   Post-procedure details: wound care instructions given   Post-procedure details comment:  Ointment and bandage applied.  Specimen 1 - Surgical pathology Differential Diagnosis: Nevus r/o BCC Check Margins: No EDC today Return in about 1 year (around 02/05/2025).  IAndrea Kerns, CMA, am acting as scribe for Rexene Rattler, MD .   Documentation: I have reviewed the above documentation for accuracy and completeness, and I agree with the above.  Rexene Rattler, MD

## 2024-02-06 NOTE — Patient Instructions (Addendum)

## 2024-02-07 LAB — SURGICAL PATHOLOGY

## 2024-02-12 ENCOUNTER — Encounter: Payer: Self-pay | Admitting: Dermatology

## 2024-02-12 ENCOUNTER — Ambulatory Visit: Payer: Self-pay | Admitting: Dermatology

## 2024-02-12 NOTE — Telephone Encounter (Signed)
-----   Message from Rexene Rattler sent at 02/12/2024  9:40 AM EDT -----  1. Skin, right lower clavicle :       BASAL CELL CARCINOMA, NODULAR PATTERN   BCC skin cancer- already treated with EDC at time of biopsy  - please call patient ----- Message ----- From: Interface, Lab In Three Zero One Sent: 02/07/2024   5:27 PM EDT To: Rexene Rattler, MD

## 2024-02-12 NOTE — Telephone Encounter (Signed)
 Left pt msg to call for bx result/sh

## 2024-02-13 NOTE — Telephone Encounter (Signed)
 Patient advised of BX results. aw

## 2025-02-10 ENCOUNTER — Encounter: Admitting: Dermatology
